# Patient Record
Sex: Female | Born: 1960 | Race: White | Hispanic: No | Marital: Married | Smoking: Former smoker
Health system: Southern US, Community
[De-identification: ages and names within clinical notes are randomized; demographics above are authoritative.]

## PROBLEM LIST (undated history)

## (undated) DIAGNOSIS — M1710 Unilateral primary osteoarthritis, unspecified knee: Secondary | ICD-10-CM

## (undated) DIAGNOSIS — M109 Gout, unspecified: Secondary | ICD-10-CM

## (undated) DIAGNOSIS — M179 Osteoarthritis of knee, unspecified: Secondary | ICD-10-CM

## (undated) DIAGNOSIS — R748 Abnormal levels of other serum enzymes: Secondary | ICD-10-CM

## (undated) DIAGNOSIS — T8859XA Other complications of anesthesia, initial encounter: Secondary | ICD-10-CM

## (undated) DIAGNOSIS — F329 Major depressive disorder, single episode, unspecified: Secondary | ICD-10-CM

## (undated) DIAGNOSIS — E785 Hyperlipidemia, unspecified: Secondary | ICD-10-CM

## (undated) DIAGNOSIS — F32A Depression, unspecified: Secondary | ICD-10-CM

## (undated) DIAGNOSIS — E559 Vitamin D deficiency, unspecified: Secondary | ICD-10-CM

## (undated) DIAGNOSIS — J189 Pneumonia, unspecified organism: Secondary | ICD-10-CM

## (undated) DIAGNOSIS — I10 Essential (primary) hypertension: Secondary | ICD-10-CM

## (undated) DIAGNOSIS — Z9889 Other specified postprocedural states: Secondary | ICD-10-CM

## (undated) DIAGNOSIS — F419 Anxiety disorder, unspecified: Secondary | ICD-10-CM

## (undated) DIAGNOSIS — R112 Nausea with vomiting, unspecified: Secondary | ICD-10-CM

## (undated) DIAGNOSIS — T4145XA Adverse effect of unspecified anesthetic, initial encounter: Secondary | ICD-10-CM

## (undated) DIAGNOSIS — R232 Flushing: Secondary | ICD-10-CM

## (undated) DIAGNOSIS — J302 Other seasonal allergic rhinitis: Secondary | ICD-10-CM

## (undated) HISTORY — DX: Depression, unspecified: F32.A

## (undated) HISTORY — DX: Major depressive disorder, single episode, unspecified: F32.9

## (undated) HISTORY — DX: Essential (primary) hypertension: I10

## (undated) HISTORY — DX: Gout, unspecified: M10.9

## (undated) HISTORY — PX: KNEE ARTHROSCOPY: SUR90

## (undated) HISTORY — PX: CARPAL TUNNEL RELEASE: SHX101

## (undated) HISTORY — PX: COLONOSCOPY: SHX174

## (undated) HISTORY — DX: Hyperlipidemia, unspecified: E78.5

## (undated) HISTORY — DX: Other seasonal allergic rhinitis: J30.2

---
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
## 2007-01-30 ENCOUNTER — Other Ambulatory Visit: Admission: RE | Admit: 2007-01-30 | Discharge: 2007-01-30 | Payer: Self-pay | Admitting: *Deleted

## 2012-01-31 ENCOUNTER — Encounter: Payer: Self-pay | Admitting: Gastroenterology

## 2012-03-05 ENCOUNTER — Ambulatory Visit (AMBULATORY_SURGERY_CENTER): Payer: BC Managed Care – PPO | Admitting: *Deleted

## 2012-03-05 VITALS — Ht 66.0 in | Wt 178.6 lb

## 2012-03-05 DIAGNOSIS — Z1211 Encounter for screening for malignant neoplasm of colon: Secondary | ICD-10-CM

## 2012-03-05 MED ORDER — MOVIPREP 100 G PO SOLR: ORAL | Status: DC

## 2012-03-07 ENCOUNTER — Encounter: Payer: Self-pay | Admitting: Gastroenterology

## 2012-03-19 ENCOUNTER — Encounter: Payer: Self-pay | Admitting: Gastroenterology

## 2012-04-26 ENCOUNTER — Emergency Department (HOSPITAL_COMMUNITY)
Admission: EM | Admit: 2012-04-26 | Discharge: 2012-04-26 | Disposition: A | Discharge status: Home / Self Care | Payer: Worker's Compensation | Attending: Emergency Medicine | Admitting: Emergency Medicine

## 2012-04-26 ENCOUNTER — Emergency Department (HOSPITAL_COMMUNITY): Payer: Worker's Compensation

## 2012-04-26 ENCOUNTER — Encounter (HOSPITAL_COMMUNITY): Payer: Self-pay | Admitting: *Deleted

## 2012-04-26 DIAGNOSIS — E785 Hyperlipidemia, unspecified: Secondary | ICD-10-CM | POA: Insufficient documentation

## 2012-04-26 DIAGNOSIS — S39012A Strain of muscle, fascia and tendon of lower back, initial encounter: Secondary | ICD-10-CM

## 2012-04-26 DIAGNOSIS — Y9389 Activity, other specified: Secondary | ICD-10-CM | POA: Insufficient documentation

## 2012-04-26 DIAGNOSIS — S139XXA Sprain of joints and ligaments of unspecified parts of neck, initial encounter: Secondary | ICD-10-CM | POA: Insufficient documentation

## 2012-04-26 DIAGNOSIS — Z7982 Long term (current) use of aspirin: Secondary | ICD-10-CM | POA: Insufficient documentation

## 2012-04-26 DIAGNOSIS — F329 Major depressive disorder, single episode, unspecified: Secondary | ICD-10-CM | POA: Insufficient documentation

## 2012-04-26 DIAGNOSIS — Z862 Personal history of diseases of the blood and blood-forming organs and certain disorders involving the immune mechanism: Secondary | ICD-10-CM | POA: Insufficient documentation

## 2012-04-26 DIAGNOSIS — F3289 Other specified depressive episodes: Secondary | ICD-10-CM | POA: Insufficient documentation

## 2012-04-26 DIAGNOSIS — S161XXA Strain of muscle, fascia and tendon at neck level, initial encounter: Secondary | ICD-10-CM

## 2012-04-26 DIAGNOSIS — S0990XA Unspecified injury of head, initial encounter: Secondary | ICD-10-CM | POA: Insufficient documentation

## 2012-04-26 DIAGNOSIS — Z87891 Personal history of nicotine dependence: Secondary | ICD-10-CM | POA: Insufficient documentation

## 2012-04-26 DIAGNOSIS — Z79899 Other long term (current) drug therapy: Secondary | ICD-10-CM | POA: Insufficient documentation

## 2012-04-26 DIAGNOSIS — W1809XA Striking against other object with subsequent fall, initial encounter: Secondary | ICD-10-CM | POA: Insufficient documentation

## 2012-04-26 DIAGNOSIS — J309 Allergic rhinitis, unspecified: Secondary | ICD-10-CM | POA: Insufficient documentation

## 2012-04-26 DIAGNOSIS — Z8639 Personal history of other endocrine, nutritional and metabolic disease: Secondary | ICD-10-CM | POA: Insufficient documentation

## 2012-04-26 DIAGNOSIS — IMO0002 Reserved for concepts with insufficient information to code with codable children: Secondary | ICD-10-CM | POA: Insufficient documentation

## 2012-04-26 DIAGNOSIS — Y929 Unspecified place or not applicable: Secondary | ICD-10-CM | POA: Insufficient documentation

## 2012-04-26 DIAGNOSIS — I10 Essential (primary) hypertension: Secondary | ICD-10-CM | POA: Insufficient documentation

## 2012-04-26 MED ORDER — HYDROCODONE-ACETAMINOPHEN 5-325 MG PO TABS
2.0000 | ORAL_TABLET | Freq: Once | ORAL | Status: AC
  Administered 2012-04-26: 2 via ORAL
  Filled 2012-04-26: qty 2

## 2012-04-26 MED ORDER — METHOCARBAMOL 500 MG PO TABS: 500.0000 mg | ORAL_TABLET | Freq: Two times a day (BID) | ORAL | Status: DC

## 2012-04-26 NOTE — ED Provider Notes (Signed)
History   This chart was scribed for non-physician practitioner working with Richardean Canal, MD by Gerlean Ren, ED Scribe. This patient was seen in room TR10C/TR10C and the patient's care was started at 3:19 PM.    CSN: [id number]  Arrival date & time 04/26/12  1159   First MD Initiated Contact with Patient 04/26/12 1500      No chief complaint on file.    The history is provided by the patient. No language interpreter was used.   [name] is a 52 y.o. female with h/o HTN and gout who presents to the Emergency Department complaining of mid-back pain rated as 5/10, neck pain rated as 5/10, and posterior head pain rated as 5/10 with associated HA after a fall while ambulating at work indoors on concrete floor.  Pt reports head trauma with the floor but denies LOC, changes in vision, or syncope.  Pt is a former smoker and denies alcohol use.  Past Medical History  Diagnosis Date  . Hypertension   . Hyperlipidemia   . Depression   . Gout   . Seasonal allergies     Past Surgical History  Procedure Date  . Cesarean section 1990  . Knee arthroscopy     right x 2  . Carpal tunnel release     right    Family History  Problem Relation Age of Onset  . Colon cancer Neg Hx   . Stomach cancer Neg Hx     History  Substance Use Topics  . Smoking status: Former Smoker    Quit date: 03/05/2004  . Smokeless tobacco: Never Used  . Alcohol Use: No    No OB history provided.   Review of Systems  HENT: Positive for neck pain.   Musculoskeletal: Positive for back pain.  Neurological: Positive for headaches. Negative for syncope.    Allergies  Demerol and Statins  Home Medications   Current Outpatient Rx  Name  Route  Sig  Dispense  Refill  . ALLOPURINOL 100 MG PO TABS   Oral   Take 100 mg by mouth daily.          Marland Kitchen AMLODIPINE BESYLATE 10 MG PO TABS   Oral   Take 10 mg by mouth daily.          . ASPIRIN 81 MG PO TBEC   Oral   Take 81 mg by mouth daily. Swallow  whole.         Marland Kitchen BENAZEPRIL-HYDROCHLOROTHIAZIDE 20-12.5 MG PO TABS   Oral   Take 2 tablets by mouth daily.          Marland Kitchen BYSTOLIC 10 MG PO TABS   Oral   Take 5 mg by mouth daily. Takes 1/2 tablet daily         . CETIRIZINE HCL 10 MG PO TABS   Oral   Take 10 mg by mouth at bedtime as needed. For allergies         . VITAMIN D 2000 UNITS PO CAPS   Oral   Take 2,000 Units by mouth daily.          Marland Kitchen FLUOXETINE HCL 40 MG PO CAPS   Oral   Take 40 mg by mouth daily.          Marland Kitchen PRAVASTATIN SODIUM 40 MG PO TABS   Oral   Take 40 mg by mouth every evening.          Marland Kitchen ZETIA 10 MG PO TABS  Oral   Take 10 mg by mouth daily.            BP 165/96  Pulse 65  Temp 97.8 F (36.6 C) (Oral)  Resp 18  SpO2 96%  Physical Exam  Nursing note and vitals reviewed. Constitutional: She is oriented to person, place, and time. She appears well-developed and well-nourished. No distress.  HENT:  Head: Normocephalic and atraumatic.       Atraumatic, no signs of laceration or contusion on the posterior head  Eyes: Conjunctivae normal and EOM are normal. Pupils are equal, round, and reactive to light.  Neck: Normal range of motion. Neck supple.       Cervical spine mildly tender to palpation over C-7 and cervical paraspinal muscles mildly tender to palpation  Cardiovascular: Normal rate, regular rhythm and normal heart sounds.  Exam reveals no gallop and no friction rub.   No murmur heard. Pulmonary/Chest: Effort normal and breath sounds normal. No respiratory distress. She has no wheezes.  Abdominal: Soft. There is no tenderness.  Musculoskeletal: Normal range of motion.       Tenderness over thoracic paraspinal muscles, lumbar paraspinal tender to palpation  Neurological: She is alert and oriented to person, place, and time. No cranial nerve deficit.       CN 3-12 intact`  Skin: Skin is warm and dry.  Psychiatric: She has a normal mood and affect. Her behavior is normal.    ED  Course  Procedures (including critical care time) DIAGNOSTIC STUDIES: Oxygen Saturation is 96% on room air, adequate by my interpretation.    COORDINATION OF CARE: 3:25 PM- Patient informed of clinical course including XR, understands medical decision-making process, and agrees with plan.   No results found for this or any previous visit. Dg Cervical Spine Complete  04/26/2012  *RADIOLOGY REPORT*  Clinical Data: Fall, hit back of head.  Neck pain.  CERVICAL SPINE - COMPLETE 4+ VIEW  Comparison: None.  Findings: Early degenerative changes of anterior spurring at C5-6. Normal alignment.  No fracture.  Prevertebral soft tissues are normal.  Mild degenerative facet disease diffusely bilaterally.  IMPRESSION: Degenerative changes as above.  No acute bony abnormality.   Original Report Authenticated By: Charlett Nose, M.D.      1. Cervical strain   2. Back strain       MDM  [place] head CT rules suggests no need for head CT this time, however I will image the patient's C-spine chief complaints of some tenderness over C7. I suspect that her tenderness in her back is likely muscle, as there is no sharp bony tenderness or obvious deformity.  I personally performed the services described in this documentation, which was scribed in my presence. The recorded information has been reviewed and is accurate.        Roxy Horseman, PA-C 04/26/12 1609

## 2012-04-26 NOTE — ED Provider Notes (Signed)
Medical screening examination/treatment/procedure(s) were performed by non-physician practitioner and as supervising physician I was immediately available for consultation/collaboration.   Annabelle Rexroad H Garnett Nunziata, MD 04/26/12 2345 

## 2012-04-26 NOTE — ED Notes (Signed)
Pt slipped on a waxed floor, hit a garbage can on her way down and landed on her back and head.  Pain to mid back and occiput.  No changes in vision.  Pt states she feels groggy, but ao x 4.  Takes 1 81mg  asa per day.  Perrl.

## 2012-04-26 NOTE — ED Notes (Signed)
Pt c/o pain in head and shoulders. States has gotten "tighter" as time goes on. No LOC.

## 2012-06-19 ENCOUNTER — Other Ambulatory Visit: Payer: Self-pay | Admitting: *Deleted

## 2012-06-19 MED ORDER — BENAZEPRIL-HYDROCHLOROTHIAZIDE 20-12.5 MG PO TABS: 2.0000 | ORAL_TABLET | Freq: Every day | ORAL | Status: DC

## 2012-07-20 ENCOUNTER — Other Ambulatory Visit: Payer: Self-pay | Admitting: Nurse Practitioner

## 2012-08-20 ENCOUNTER — Other Ambulatory Visit: Payer: Self-pay | Admitting: Nurse Practitioner

## 2012-08-21 NOTE — Telephone Encounter (Signed)
Patient last seen 01/23/12

## 2012-09-19 ENCOUNTER — Other Ambulatory Visit: Payer: Self-pay | Admitting: Nurse Practitioner

## 2012-09-26 ENCOUNTER — Other Ambulatory Visit: Payer: Self-pay | Admitting: Nurse Practitioner

## 2012-09-26 ENCOUNTER — Ambulatory Visit (INDEPENDENT_AMBULATORY_CARE_PROVIDER_SITE_OTHER): Payer: BC Managed Care – PPO | Admitting: Physician Assistant

## 2012-09-26 ENCOUNTER — Other Ambulatory Visit: Payer: Self-pay

## 2012-09-26 ENCOUNTER — Encounter: Payer: Self-pay | Admitting: Physician Assistant

## 2012-09-26 VITALS — BP 134/83 | HR 62 | Temp 97.0°F | Wt 178.0 lb

## 2012-09-26 DIAGNOSIS — E1159 Type 2 diabetes mellitus with other circulatory complications: Secondary | ICD-10-CM | POA: Insufficient documentation

## 2012-09-26 DIAGNOSIS — F329 Major depressive disorder, single episode, unspecified: Secondary | ICD-10-CM | POA: Insufficient documentation

## 2012-09-26 DIAGNOSIS — E785 Hyperlipidemia, unspecified: Secondary | ICD-10-CM | POA: Insufficient documentation

## 2012-09-26 DIAGNOSIS — I152 Hypertension secondary to endocrine disorders: Secondary | ICD-10-CM | POA: Insufficient documentation

## 2012-09-26 DIAGNOSIS — I10 Essential (primary) hypertension: Secondary | ICD-10-CM

## 2012-09-26 DIAGNOSIS — M109 Gout, unspecified: Secondary | ICD-10-CM

## 2012-09-26 DIAGNOSIS — E781 Pure hyperglyceridemia: Secondary | ICD-10-CM | POA: Insufficient documentation

## 2012-09-26 LAB — LIPID PANEL
Total CHOL/HDL Ratio: 4.2 Ratio
VLDL: 46 mg/dL — ABNORMAL HIGH (ref 0–40)

## 2012-09-26 LAB — BASIC METABOLIC PANEL WITH GFR
GFR, Est African American: >89 mL/min
GFR, Est Non African American: >89 mL/min
Glucose, Bld: 86 mg/dL (ref 70–99)
Potassium: 3.6 mEq/L (ref 3.5–5.3)
Sodium: 141 mEq/L (ref 135–145)

## 2012-09-26 LAB — HEPATIC FUNCTION PANEL
Bilirubin, Direct: 0.1 mg/dL (ref 0.0–0.3)
Indirect Bilirubin: 0.3 mg/dL (ref 0.0–0.9)

## 2012-09-26 MED ORDER — BENAZEPRIL-HYDROCHLOROTHIAZIDE 20-12.5 MG PO TABS: 2.0000 | ORAL_TABLET | Freq: Every day | ORAL | Status: DC

## 2012-09-26 MED ORDER — FLUOXETINE HCL 40 MG PO CAPS: 40.0000 mg | ORAL_CAPSULE | Freq: Every day | ORAL | Status: DC

## 2012-09-26 MED ORDER — NEBIVOLOL HCL 10 MG PO TABS: 5.0000 mg | ORAL_TABLET | Freq: Every day | ORAL | Status: DC

## 2012-09-26 MED ORDER — PRAVASTATIN SODIUM 40 MG PO TABS: 40.0000 mg | ORAL_TABLET | Freq: Every evening | ORAL | Status: DC

## 2012-09-26 MED ORDER — ALLOPURINOL 100 MG PO TABS: 100.0000 mg | ORAL_TABLET | Freq: Every day | ORAL | Status: DC

## 2012-09-26 MED ORDER — AMLODIPINE BESYLATE 10 MG PO TABS: 10.0000 mg | ORAL_TABLET | Freq: Every day | ORAL | Status: DC

## 2012-09-26 NOTE — Progress Notes (Signed)
Subjective:     Patient ID: [name], female   DOB: 06/12/1960, 52 y.o.   MRN: [id number]  HPI Pt here for f/u of HTN, gout, hyperlipid, and depression She states she has been doing well She denies any CP,SOB, lower ext edema, or change in endurance She relate hx of multiple joint pain Pt with a hx of gout and started on Allopurinol After further discussion she states she does not take this on a regular basis She also has a hx of not tolerating statins well but has done fine on the Pravachol  Review of Systems  All other systems reviewed and are negative.       Objective:   Physical Exam  Nursing note and vitals reviewed.  No bruits Heart- RRR w/o M Lungs- CTA bilat Pulses equal in upper ext No lower ext edema Mood approp Relaxed with good eye contact/interaction during exam    Assessment:     1. HTN (hypertension)   2. Gout   3. Other and unspecified hyperlipidemia   4. Depression        Plan:     Push fluids Take Allopurinol daily Continue all other meds RF done today for 6 months on all meds F/U in 6 months

## 2012-09-26 NOTE — Patient Instructions (Signed)

## 2012-10-19 ENCOUNTER — Other Ambulatory Visit: Payer: Self-pay | Admitting: Physician Assistant

## 2012-11-08 ENCOUNTER — Other Ambulatory Visit: Payer: Self-pay | Admitting: Nurse Practitioner

## 2012-11-08 ENCOUNTER — Ambulatory Visit: Payer: Self-pay | Admitting: Family Medicine

## 2013-02-07 ENCOUNTER — Other Ambulatory Visit: Payer: Self-pay

## 2013-02-08 ENCOUNTER — Other Ambulatory Visit: Payer: Self-pay | Admitting: Nurse Practitioner

## 2013-02-25 ENCOUNTER — Ambulatory Visit (INDEPENDENT_AMBULATORY_CARE_PROVIDER_SITE_OTHER): Payer: BC Managed Care – PPO | Admitting: General Practice

## 2013-02-25 VITALS — BP 123/73 | HR 72 | Temp 97.1°F | Ht 66.0 in | Wt 180.0 lb

## 2013-02-25 DIAGNOSIS — J329 Chronic sinusitis, unspecified: Secondary | ICD-10-CM

## 2013-02-25 MED ORDER — AZITHROMYCIN 250 MG PO TABS: ORAL_TABLET | ORAL | Status: DC

## 2013-02-25 NOTE — Patient Instructions (Signed)

## 2013-02-25 NOTE — Progress Notes (Signed)
  Subjective:    Patient ID: [name], female    DOB: 04/15/1960, 52 y.o.   MRN: [id number]  HPI Patient presents with complaints of slight dizziness, nasal congestion and sinus pressure. Reports onset was yesterday and gradually worsened. She reports a history of vertigo 10 years ago. Reports taking zyrtec lastnight with minimal relief.     Review of Systems  Constitutional: Negative for fever and chills.  HENT: Positive for congestion, postnasal drip and sinus pressure.   Respiratory: Negative for chest tightness, shortness of breath and wheezing.   Cardiovascular: Negative for chest pain and palpitations.  Neurological: Positive for dizziness. Negative for weakness and headaches.       Objective:   Physical Exam  Constitutional: She appears well-developed and well-nourished.  HENT:  Head: Normocephalic and atraumatic.  Nose: Right sinus exhibits maxillary sinus tenderness and frontal sinus tenderness. Left sinus exhibits maxillary sinus tenderness and frontal sinus tenderness.  Mouth/Throat: Posterior oropharyngeal erythema present.  Cardiovascular: Normal rate, regular rhythm and normal heart sounds.   No murmur heard. Pulmonary/Chest: Effort normal and breath sounds normal. No respiratory distress. She exhibits no tenderness.  Neurological: She is alert.  Skin: Skin is warm and dry. No rash noted.  Psychiatric: She has a normal mood and affect.          Assessment & Plan:  1. Sinusitis  - azithromycin (ZITHROMAX) 250 MG tablet; Take as directed  Dispense: 6 tablet; Refill: 0 -increase fluids -RTO if symptoms worsen or unresolved -Patient verbalized understanding -Coralie Keens, FNP-C

## 2013-05-17 ENCOUNTER — Other Ambulatory Visit: Payer: Self-pay | Admitting: Physician Assistant

## 2013-05-22 ENCOUNTER — Other Ambulatory Visit: Payer: Self-pay | Admitting: Physician Assistant

## 2013-06-07 ENCOUNTER — Other Ambulatory Visit: Payer: Self-pay | Admitting: Physician Assistant

## 2013-06-11 ENCOUNTER — Telehealth: Payer: Self-pay | Admitting: Nurse Practitioner

## 2013-06-11 NOTE — Telephone Encounter (Signed)
Increase in stress level at work and at home. Patient had to leave work today due to stress. Appt scheduled for tomorrow. Patient aware.

## 2013-06-12 ENCOUNTER — Ambulatory Visit (INDEPENDENT_AMBULATORY_CARE_PROVIDER_SITE_OTHER): Payer: BC Managed Care – PPO | Admitting: Family Medicine

## 2013-06-12 VITALS — BP 134/73 | HR 71 | Temp 98.7°F | Ht 66.0 in | Wt 184.2 lb

## 2013-06-12 DIAGNOSIS — M109 Gout, unspecified: Secondary | ICD-10-CM

## 2013-06-12 DIAGNOSIS — Z0000 Encounter for general adult medical examination without abnormal findings: Secondary | ICD-10-CM

## 2013-06-12 DIAGNOSIS — R232 Flushing: Secondary | ICD-10-CM

## 2013-06-12 DIAGNOSIS — E785 Hyperlipidemia, unspecified: Secondary | ICD-10-CM

## 2013-06-12 DIAGNOSIS — I10 Essential (primary) hypertension: Secondary | ICD-10-CM

## 2013-06-12 DIAGNOSIS — N951 Menopausal and female climacteric states: Secondary | ICD-10-CM

## 2013-06-12 DIAGNOSIS — F32A Depression, unspecified: Secondary | ICD-10-CM

## 2013-06-12 DIAGNOSIS — F3289 Other specified depressive episodes: Secondary | ICD-10-CM

## 2013-06-12 DIAGNOSIS — F329 Major depressive disorder, single episode, unspecified: Secondary | ICD-10-CM

## 2013-06-12 MED ORDER — ALLOPURINOL 100 MG PO TABS: 100.0000 mg | ORAL_TABLET | Freq: Every day | ORAL | Status: DC

## 2013-06-12 MED ORDER — AMLODIPINE BESYLATE 10 MG PO TABS: 10.0000 mg | ORAL_TABLET | Freq: Every day | ORAL | Status: DC

## 2013-06-12 MED ORDER — ALPRAZOLAM 0.5 MG PO TABS: 0.5000 mg | ORAL_TABLET | Freq: Three times a day (TID) | ORAL | Status: DC | PRN

## 2013-06-12 MED ORDER — BENAZEPRIL-HYDROCHLOROTHIAZIDE 20-12.5 MG PO TABS: 2.0000 | ORAL_TABLET | Freq: Every day | ORAL | Status: DC

## 2013-06-12 MED ORDER — CLONIDINE HCL 0.1 MG PO TABS: ORAL_TABLET | ORAL | Status: DC

## 2013-06-12 MED ORDER — NEBIVOLOL HCL 10 MG PO TABS: 10.0000 mg | ORAL_TABLET | Freq: Every day | ORAL | Status: DC

## 2013-06-12 MED ORDER — PRAVASTATIN SODIUM 40 MG PO TABS: 40.0000 mg | ORAL_TABLET | Freq: Every evening | ORAL | Status: DC

## 2013-06-12 MED ORDER — FLUOXETINE HCL 40 MG PO CAPS: 40.0000 mg | ORAL_CAPSULE | Freq: Every day | ORAL | Status: DC

## 2013-06-12 NOTE — Patient Instructions (Signed)
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
## 2013-06-13 LAB — VITAMIN B12: Vitamin B-12: 517 pg/mL (ref 211–946)

## 2013-06-13 LAB — CMP14+EGFR
ALT: 81 IU/L — ABNORMAL HIGH (ref 0–32)
AST: 43 IU/L — ABNORMAL HIGH (ref 0–40)
Albumin/Globulin Ratio: 2 (ref 1.1–2.5)
Albumin: 4.7 g/dL (ref 3.5–5.5)
Alkaline Phosphatase: 99 IU/L (ref 39–117)
BUN/Creatinine Ratio: 19 (ref 9–23)
BUN: 11 mg/dL (ref 6–24)
CO2: 23 mmol/L (ref 18–29)
Calcium: 10.3 mg/dL — ABNORMAL HIGH (ref 8.7–10.2)
Chloride: 101 mmol/L (ref 97–108)
Creatinine, Ser: 0.58 mg/dL (ref 0.57–1.00)
GFR calc Af Amer: 123 mL/min/{1.73_m2} (ref >59)
GFR calc non Af Amer: 107 mL/min/{1.73_m2} (ref >59)
Globulin, Total: 2.4 g/dL (ref 1.5–4.5)
Glucose: 125 mg/dL — ABNORMAL HIGH (ref 65–99)
Potassium: 3.8 mmol/L (ref 3.5–5.2)
Sodium: 144 mmol/L (ref 134–144)
Total Bilirubin: 0.3 mg/dL (ref 0.0–1.2)
Total Protein: 7.1 g/dL (ref 6.0–8.5)

## 2013-06-13 LAB — LIPID PANEL
Chol/HDL Ratio: 3.5 ratio units (ref 0.0–4.4)
Cholesterol, Total: 228 mg/dL — ABNORMAL HIGH (ref 100–199)
HDL: 66 mg/dL (ref >39)
LDL Calculated: 126 mg/dL — ABNORMAL HIGH (ref 0–99)
Triglycerides: 179 mg/dL — ABNORMAL HIGH (ref 0–149)
VLDL Cholesterol Cal: 36 mg/dL (ref 5–40)

## 2013-06-13 LAB — VITAMIN D 25 HYDROXY (VIT D DEFICIENCY, FRACTURES): Vit D, 25-Hydroxy: 28.3 ng/mL — ABNORMAL LOW (ref 30.0–100.0)

## 2013-06-13 LAB — ARTHRITIS PANEL
Anti Nuclear Antibody(ANA): NEGATIVE
Rhuematoid fact SerPl-aCnc: 10.7 IU/mL (ref 0.0–13.9)
Sed Rate: 5 mm/hr (ref 0–40)
Uric Acid: 5.8 mg/dL (ref 2.5–7.1)

## 2013-06-13 LAB — THYROID PANEL WITH TSH
Free Thyroxine Index: 2 (ref 1.2–4.9)
T3 Uptake Ratio: 25 % (ref 24–39)
T4, Total: 8 ug/dL (ref 4.5–12.0)
TSH: 1.46 u[IU]/mL (ref 0.450–4.500)

## 2013-06-13 LAB — FOLLICLE STIMULATING HORMONE: FSH: 75.4 m[IU]/mL

## 2013-06-17 ENCOUNTER — Other Ambulatory Visit: Payer: Self-pay | Admitting: Family Medicine

## 2013-06-27 ENCOUNTER — Encounter: Payer: Self-pay | Admitting: Family Medicine

## 2013-06-27 ENCOUNTER — Ambulatory Visit (INDEPENDENT_AMBULATORY_CARE_PROVIDER_SITE_OTHER): Payer: BC Managed Care – PPO | Admitting: Family Medicine

## 2013-06-27 VITALS — BP 137/73 | HR 59 | Temp 97.9°F | Ht 66.0 in | Wt 183.8 lb

## 2013-06-27 DIAGNOSIS — E559 Vitamin D deficiency, unspecified: Secondary | ICD-10-CM

## 2013-06-27 DIAGNOSIS — R748 Abnormal levels of other serum enzymes: Secondary | ICD-10-CM

## 2013-06-27 MED ORDER — VITAMIN D (ERGOCALCIFEROL) 1.25 MG (50000 UNIT) PO CAPS: 50000.0000 [IU] | ORAL_CAPSULE | ORAL | Status: DC

## 2013-06-27 NOTE — Progress Notes (Signed)
   Subjective:    Patient ID: [name], female    DOB: [birth year], 53 y.o.   MRN: [id number]  HPI  This 53 y.o. female presents for evaluation of anxiety and hot flashes.  She is feeling better since using xanax and she is not panicking as much.  She is taking the clonidine 0.1mg  po qhs and this is helping her not to have hot flashes.  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  She had labs and her LFT's were elevated.  Review of Systems No chest pain, SOB, HA, dizziness, vision change, N/V, diarrhea, constipation, dysuria, urinary urgency or frequency, myalgias, arthralgias or rash.     Objective:   Physical Exam  Vital signs noted  Well developed well nourished female.  HEENT - Head atraumatic Normocephalic                Eyes - PERRLA, Conjuctiva - clear Sclera- Clear EOMI                Ears - EAC's Wnl TM's Wnl Gross Hearing WNL                Nose - Nares patent                 Throat - oropharanx wnl Respiratory - Lungs CTA bilateral Cardiac - RRR S1 and S2 without murmur GI - Abdomen soft Nontender and bowel sounds active x 4 Extremities - No edema. Neuro - Grossly intact.      Assessment & Plan:  Elevated liver enzymes - Plan: Vitamin D, Ergocalciferol, (DRISDOL) 50000 UNITS CAPS capsule  Unspecified vitamin D deficiency - Plan: Hepatic function panel, Hepatitis panel, acute  Anxiety - Better controlled with xanax and follow up prn if needing xanax refill.  Amy CanterWilliam J Oxford FNP

## 2013-06-28 LAB — HEPATIC FUNCTION PANEL
ALT: 69 IU/L — ABNORMAL HIGH (ref 0–32)
AST: 39 IU/L (ref 0–40)
Albumin: 4.6 g/dL (ref 3.5–5.5)
Alkaline Phosphatase: 104 IU/L (ref 39–117)
Bilirubin, Direct: 0.13 mg/dL (ref 0.00–0.40)
Total Bilirubin: 0.4 mg/dL (ref 0.0–1.2)
Total Protein: 6.9 g/dL (ref 6.0–8.5)

## 2013-06-28 LAB — HEPATITIS PANEL, ACUTE
Hep A IgM: NEGATIVE
Hep B C IgM: NEGATIVE
Hep C Virus Ab: <0.1 s/co ratio (ref 0.0–0.9)
Hepatitis B Surface Ag: NEGATIVE

## 2013-07-04 ENCOUNTER — Telehealth: Payer: Self-pay | Admitting: Family Medicine

## 2013-07-09 NOTE — Telephone Encounter (Signed)
Done, faxed forms

## 2013-07-16 NOTE — Telephone Encounter (Signed)
Gina refaxed information to short term disability per fax number pt give.  Pt notified.  rs

## 2013-10-08 ENCOUNTER — Other Ambulatory Visit: Payer: Self-pay | Admitting: *Deleted

## 2013-10-08 ENCOUNTER — Other Ambulatory Visit: Payer: Self-pay | Admitting: Family Medicine

## 2013-10-08 ENCOUNTER — Telehealth: Payer: Self-pay | Admitting: Family Medicine

## 2013-10-08 DIAGNOSIS — F32A Depression, unspecified: Secondary | ICD-10-CM

## 2013-10-08 DIAGNOSIS — F329 Major depressive disorder, single episode, unspecified: Secondary | ICD-10-CM

## 2013-10-08 NOTE — Telephone Encounter (Signed)
Pt changing pharmacy and needs [facility] [place]. Last ov 06/27/13.

## 2013-10-08 NOTE — Telephone Encounter (Signed)
Appointment given for Friday at 10:30

## 2013-10-11 ENCOUNTER — Ambulatory Visit (INDEPENDENT_AMBULATORY_CARE_PROVIDER_SITE_OTHER): Payer: BC Managed Care – PPO | Admitting: Family Medicine

## 2013-10-11 ENCOUNTER — Encounter: Payer: Self-pay | Admitting: Family Medicine

## 2013-10-11 VITALS — BP 142/78 | HR 56 | Temp 97.0°F | Ht 66.0 in | Wt 184.4 lb

## 2013-10-11 DIAGNOSIS — F3289 Other specified depressive episodes: Secondary | ICD-10-CM

## 2013-10-11 DIAGNOSIS — F329 Major depressive disorder, single episode, unspecified: Secondary | ICD-10-CM

## 2013-10-11 DIAGNOSIS — F32A Depression, unspecified: Secondary | ICD-10-CM

## 2013-10-11 MED ORDER — ALPRAZOLAM 0.5 MG PO TABS: 0.5000 mg | ORAL_TABLET | Freq: Three times a day (TID) | ORAL | Status: DC | PRN

## 2013-10-11 NOTE — Progress Notes (Signed)
   Subjective:    Patient ID: [name], female    DOB: 07-11-1960, 53 y.o.   MRN: [id number]  HPI This 53 y.o. female presents for evaluation of anxiety.  She is taking xanax as relief and it is working well.   Review of Systems    No chest pain, SOB, HA, dizziness, vision change, N/V, diarrhea, constipation, dysuria, urinary urgency or frequency, myalgias, arthralgias or rash.  Objective:   Physical Exam  Vital signs noted  Well developed well nourished female.  HEENT - Head atraumatic Normocephalic                Eyes - PERRLA, Conjuctiva - clear Sclera- Clear EOMI                Ears - EAC's Wnl TM's Wnl Gross Hearing WNL                Nose - Nares patent                 Throat - oropharanx wnl Respiratory - Lungs CTA bilateral Cardiac - RRR S1 and S2 without murmur GI - Abdomen soft Nontender and bowel sounds active x 4 Extremities - No edema. Neuro - Grossly intact.      Assessment & Plan:  Depression - Plan: ALPRAZolam (XANAX) 0.5 MG tablet Follow up for refills  Deatra CanterWilliam J Herman Mell FNP

## 2013-10-18 ENCOUNTER — Ambulatory Visit: Payer: BC Managed Care – PPO | Admitting: General Practice

## 2013-11-11 ENCOUNTER — Other Ambulatory Visit: Payer: Self-pay

## 2013-11-11 DIAGNOSIS — F32A Depression, unspecified: Secondary | ICD-10-CM

## 2013-11-11 DIAGNOSIS — R232 Flushing: Secondary | ICD-10-CM

## 2013-11-11 DIAGNOSIS — I10 Essential (primary) hypertension: Secondary | ICD-10-CM

## 2013-11-11 DIAGNOSIS — F329 Major depressive disorder, single episode, unspecified: Secondary | ICD-10-CM

## 2013-11-11 MED ORDER — FLUOXETINE HCL 40 MG PO CAPS: 40.0000 mg | ORAL_CAPSULE | Freq: Every day | ORAL | Status: DC

## 2013-11-11 MED ORDER — CLONIDINE HCL 0.1 MG PO TABS
ORAL_TABLET | ORAL | Status: DC
Start: 2013-11-11 — End: 2018-09-10

## 2013-11-11 MED ORDER — BENAZEPRIL-HYDROCHLOROTHIAZIDE 20-12.5 MG PO TABS: 2.0000 | ORAL_TABLET | Freq: Every day | ORAL | Status: DC

## 2013-11-17 ENCOUNTER — Other Ambulatory Visit: Payer: Self-pay | Admitting: Physician Assistant

## 2013-11-19 ENCOUNTER — Telehealth: Payer: Self-pay | Admitting: Family Medicine

## 2013-11-19 MED ORDER — AMLODIPINE BESYLATE 10 MG PO TABS: ORAL_TABLET | ORAL | Status: DC

## 2013-11-19 NOTE — Telephone Encounter (Signed)
Pt needs 90 supply on Amlodipine Please review and advise

## 2013-11-19 NOTE — Telephone Encounter (Signed)
Pt notified [facility]  

## 2013-11-29 ENCOUNTER — Other Ambulatory Visit: Payer: Self-pay

## 2013-11-29 DIAGNOSIS — E785 Hyperlipidemia, unspecified: Secondary | ICD-10-CM

## 2013-11-29 MED ORDER — PRAVASTATIN SODIUM 40 MG PO TABS: 40.0000 mg | ORAL_TABLET | Freq: Every evening | ORAL | Status: DC

## 2014-01-29 ENCOUNTER — Other Ambulatory Visit: Payer: Self-pay | Admitting: Physician Assistant

## 2014-02-20 ENCOUNTER — Other Ambulatory Visit: Payer: Self-pay | Admitting: Family Medicine

## 2014-02-20 NOTE — Telephone Encounter (Signed)
Called patient, left message for her to call back and schedule follow up appointment as soon as possible.

## 2014-02-26 ENCOUNTER — Other Ambulatory Visit: Payer: Self-pay | Admitting: Family Medicine

## 2014-02-28 NOTE — Telephone Encounter (Signed)
Last labs 06/2013 

## 2014-04-25 ENCOUNTER — Other Ambulatory Visit: Payer: Self-pay | Admitting: Family Medicine

## 2014-05-18 ENCOUNTER — Other Ambulatory Visit: Payer: Self-pay | Admitting: Family Medicine

## 2014-05-19 NOTE — Telephone Encounter (Signed)
Patient of Amy SladeBill Oxford. Last seen in office on 10-11-13. Was notified at last refill that she NTBS. Please advise on a 90 day supply.

## 2014-05-20 NOTE — Telephone Encounter (Signed)
no more refills without being seen  

## 2014-06-07 ENCOUNTER — Other Ambulatory Visit: Payer: Self-pay | Admitting: Family Medicine

## 2014-06-09 NOTE — Telephone Encounter (Signed)
Last seen 10/11/13 B Oxford  Last lipid 06/12/13  No upcoming appt  Requesting a 90 day supply

## 2014-07-02 ENCOUNTER — Other Ambulatory Visit: Payer: Self-pay | Admitting: Family Medicine

## 2014-07-28 ENCOUNTER — Other Ambulatory Visit: Payer: Self-pay | Admitting: Family Medicine

## 2014-08-03 ENCOUNTER — Other Ambulatory Visit: Payer: Self-pay | Admitting: Family Medicine

## 2014-08-23 ENCOUNTER — Other Ambulatory Visit: Payer: Self-pay | Admitting: Family Medicine

## 2014-10-28 ENCOUNTER — Other Ambulatory Visit: Payer: Self-pay | Admitting: Family Medicine

## 2016-02-12 ENCOUNTER — Ambulatory Visit (INDEPENDENT_AMBULATORY_CARE_PROVIDER_SITE_OTHER): Payer: Self-pay | Admitting: Nurse Practitioner

## 2016-02-12 ENCOUNTER — Encounter: Payer: Self-pay | Admitting: Nurse Practitioner

## 2016-02-12 VITALS — BP 122/75 | HR 64 | Ht 65.0 in | Wt 189.0 lb

## 2016-02-12 DIAGNOSIS — Z024 Encounter for examination for driving license: Secondary | ICD-10-CM

## 2016-02-12 NOTE — Progress Notes (Signed)
Patient ID: [name], female   DOB: 11-12-1960, 55 y.o.   MRN: [id number]  [employer]- see scanned in assessment

## 2017-04-10 ENCOUNTER — Encounter: Payer: Self-pay | Admitting: Family Medicine

## 2017-04-10 ENCOUNTER — Ambulatory Visit: Payer: BC Managed Care – PPO | Admitting: Family Medicine

## 2017-04-10 VITALS — BP 113/75 | HR 62 | Temp 97.0°F | Ht 65.0 in | Wt 193.8 lb

## 2017-04-10 DIAGNOSIS — J0110 Acute frontal sinusitis, unspecified: Secondary | ICD-10-CM

## 2017-04-10 LAB — CULTURE, GROUP A STREP

## 2017-04-10 LAB — VERITOR FLU A/B WAIVED
INFLUENZA A: NEGATIVE
Influenza B: NEGATIVE

## 2017-04-10 LAB — RAPID STREP SCREEN (MED CTR MEBANE ONLY): STREP GP A AG, IA W/REFLEX: NEGATIVE

## 2017-04-10 MED ORDER — METHYLPREDNISOLONE ACETATE 80 MG/ML IJ SUSP
80.0000 mg | Freq: Once | INTRAMUSCULAR | Status: AC
  Administered 2017-04-10: 80 mg via INTRAMUSCULAR

## 2017-04-10 MED ORDER — AMOXICILLIN-POT CLAVULANATE 875-125 MG PO TABS: 1.0000 | ORAL_TABLET | Freq: Two times a day (BID) | ORAL | 0 refills | Status: DC

## 2017-04-10 NOTE — Patient Instructions (Signed)
Great to meet you!   Sinusitis, Adult Sinusitis is soreness and inflammation of your sinuses. Sinuses are hollow spaces in the bones around your face. They are located:  Around your eyes.  In the middle of your forehead.  Behind your nose.  In your cheekbones.  Your sinuses and nasal passages are lined with a stringy fluid (mucus). Mucus normally drains out of your sinuses. When your nasal tissues get inflamed or swollen, the mucus can get trapped or blocked so air cannot flow through your sinuses. This lets bacteria, viruses, and funguses grow, and that leads to infection. Follow these instructions at home: Medicines  Take, use, or apply over-the-counter and prescription medicines only as told by your doctor. These may include nasal sprays.  If you were prescribed an antibiotic medicine, take it as told by your doctor. Do not stop taking the antibiotic even if you start to feel better. Hydrate and Humidify  Drink enough water to keep your pee (urine) clear or pale yellow.  Use a cool mist humidifier to keep the humidity level in your home above 50%.  Breathe in steam for 10-15 minutes, 3-4 times a day or as told by your doctor. You can do this in the bathroom while a hot shower is running.  Try not to spend time in cool or dry air. Rest  Rest as much as possible.  Sleep with your head raised (elevated).  Make sure to get enough sleep each night. General instructions  Put a warm, moist washcloth on your face 3-4 times a day or as told by your doctor. This will help with discomfort.  Wash your hands often with soap and water. If there is no soap and water, use hand sanitizer.  Do not smoke. Avoid being around people who are smoking (secondhand smoke).  Keep all follow-up visits as told by your doctor. This is important. Contact a doctor if:  You have a fever.  Your symptoms get worse.  Your symptoms do not get better within 10 days. Get help right away if:  You  have a very bad headache.  You cannot stop throwing up (vomiting).  You have pain or swelling around your face or eyes.  You have trouble seeing.  You feel confused.  Your neck is stiff.  You have trouble breathing. This information is not intended to replace advice given to you by your health care provider. Make sure you discuss any questions you have with your health care provider. Document Released: 09/07/2007 Document Revised: 11/15/2015 Document Reviewed: 01/14/2015 Elsevier Interactive Patient Education  2018 Elsevier Inc.  

## 2017-04-10 NOTE — Progress Notes (Signed)
   HPI  Patient presents today here with acute illness.  Patient explains she has had 3-4 days of cough, congestion, headache, sinus tenderness over the frontal sinuses, body aches, and sore throat.  She is tolerating food and fluids like usual. She does have intermittent shortness of breath and nasal congestion. She denies chest pain.  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  PMH: Smoking status noted ROS: Per HPI  Objective: BP 113/75   Pulse 62   Temp (!) 97 F (36.1 C) (Oral)   Ht 5\' 5"  (1.651 m)   Wt 193 lb 12.8 oz (87.9 kg)   SpO2 96%   BMI 32.25 kg/m  Gen: NAD, alert, cooperative with exam HEENT: NCAT, oropharynx moist and clear, TMs normal bilaterally, nares clear, positive tenderness to palpation over the frontal sinuses bilaterally CV: RRR, good S1/S2, no murmur Resp: CTABL, no wheezes, non-labored Ext: No edema, warm Neuro: Alert and oriented, No gross deficits  Assessment and plan:  #Acute sinusitis Treat with Augmentin, likely superimposed viral illness as well. Discussed usual course of illness and supportive care Return to clinic as needed     Orders Placed This Encounter  Procedures  . Culture, Group A Strep    Order Specific Question:   Source    Answer:   throat  . Rapid Strep Screen (Not at [facility])  . Veritor Flu A/B Waived    Order Specific Question:   Source    Answer:   nasal    Murtis SinkSam Mikaeel Petrow, MD [facility] 04/10/2017, 11:00 AM

## 2017-04-12 LAB — CULTURE, GROUP A STREP: Strep A Culture: NEGATIVE

## 2017-05-05 ENCOUNTER — Telehealth: Payer: Self-pay | Admitting: Nurse Practitioner

## 2017-06-16 ENCOUNTER — Encounter: Payer: Self-pay | Admitting: Family Medicine

## 2017-06-16 ENCOUNTER — Ambulatory Visit: Payer: BC Managed Care – PPO | Admitting: Family Medicine

## 2017-06-16 VITALS — BP 126/71 | HR 62 | Temp 97.7°F | Resp 22 | Ht 65.0 in | Wt 197.8 lb

## 2017-06-16 DIAGNOSIS — J40 Bronchitis, not specified as acute or chronic: Secondary | ICD-10-CM

## 2017-06-16 DIAGNOSIS — J329 Chronic sinusitis, unspecified: Secondary | ICD-10-CM

## 2017-06-16 MED ORDER — PSEUDOEPHEDRINE-GUAIFENESIN ER 60-600 MG PO TB12: 1.0000 | ORAL_TABLET | Freq: Two times a day (BID) | ORAL | 0 refills | Status: AC

## 2017-06-16 MED ORDER — FLUCONAZOLE 150 MG PO TABS: 150.0000 mg | ORAL_TABLET | Freq: Once | ORAL | 0 refills | Status: AC

## 2017-06-16 MED ORDER — AMOXICILLIN-POT CLAVULANATE 875-125 MG PO TABS: 1.0000 | ORAL_TABLET | Freq: Two times a day (BID) | ORAL | 0 refills | Status: DC

## 2017-06-16 NOTE — Progress Notes (Addendum)
No chief complaint on file.   HPI  Patient presents today for Patient presents with upper respiratory congestion. Rhinorrhea that is frequently purulent. There is moderate sore throat. Chest hurts starting today. Patient reports coughing frequently as well.  Loose, but no sputum noted. There is subjective fever, with chills & sweats. The patientreports being short of breath. Onset was 5 days ago.without improvement.  PMH: Smoking status noted ROS: Per HPI  Objective: BP 126/71   Pulse 62   Temp 97.7 F (36.5 C) (Oral)   Resp (!) 22   Ht 5\' 5"  (1.651 m)   Wt 197 lb 12.8 oz (89.7 kg)   SpO2 96%   BMI 32.92 kg/m  Gen: NAD, alert, cooperative with exam HEENT: NCAT, Nasal passages swollen, red  CV: RRR, good S1/S2, no murmur Resp: Bronchitis changes with scattered wheezes, non-labored Ext: No edema, warm Neuro: Alert and oriented, No gross deficits  Assessment and plan:  1. Sinobronchitis     Meds ordered this encounter  Medications  . pseudoephedrine-guaifenesin (MUCINEX D) 60-600 MG 12 hr tablet    Sig: Take 1 tablet by mouth every 12 (twelve) hours for 10 days. As needed for congestion    Dispense:  20 tablet    Refill:  0  . amoxicillin-clavulanate (AUGMENTIN) 875-125 MG tablet    Sig: Take 1 tablet by mouth 2 (two) times daily. Take all of this medication    Dispense:  20 tablet    Refill:  0  . fluconazole (DIFLUCAN) 150 MG tablet    Sig: Take 1 tablet (150 mg total) by mouth once for 1 dose. At onset of symptoms. Repeat at end of treatment    Dispense:  2 tablet    Refill:  0    No orders of the defined types were placed in this encounter.   Follow up as needed.  Mechele ClaudeWarren Erman Thum, MD

## 2017-06-16 NOTE — Addendum Note (Signed)
Addended by: Mechele ClaudeSTACKS, Sharlotte Baka on: 06/16/2017 03:35 PM   Modules accepted: Orders

## 2017-08-01 LAB — HM MAMMOGRAPHY

## 2018-09-11 ENCOUNTER — Encounter (HOSPITAL_COMMUNITY): Payer: Self-pay

## 2018-09-11 NOTE — Patient Instructions (Addendum)
DUE TO COVID-19 NO VISITORS ARE ALLOWED IN THE HOSPITAL AT THIS TIME   COVID SWAB TESTING MUST BE COMPLETED ON:  Thursday, September 20, 2018@   2:00 pm           (Must self quarantine after testing. Follow instructions on handout.)   Your procedure is scheduled on: Monday, September 24, 2018   Surgery Time:  7:15AM-9:15AM   Report to [facility] Main  Entrance   Report to Short Stay at 5:30 AM   Call this number if you have problems the morning of surgery [phone]   Do not eat food:After Midnight.   May have liquids until 4:30AM day of surgery   CLEAR LIQUID DIET  Foods Allowed                                                                     Foods Excluded  Water, Black Coffee and tea, regular and decaf                             liquids that you cannot  Plain Jell-O in any flavor                                             see through such as: Fruit ices (not with fruit pulp)                                     milk, soups, orange juice  Iced Popsicles                                    All solid food Carbonated beverages, regular and diet                                    Cranberry, grape and apple juices Sports drinks like Gatorade Lightly seasoned clear broth or consume(fat free) Sugar, honey syrup  Sample Menu Breakfast                                Lunch                                     Supper Cranberry juice                    Beef broth                            Chicken broth Jell-O                                     Grape juice  Apple juice Coffee or tea                        Jell-O                                      Popsicle                                                Coffee or tea                        Coffee or tea   Complete one Ensure drink the morning of surgery at 4:30AM the day of surgery.   Brush your teeth the morning of surgery.   Do NOT smoke after Midnight   Take these medicines the morning of surgery  with A SIP OF WATER: Allopurinol, Amlodipine, Bystolic, Clonidine, Escitalopram, Lorazepam if needed                               You may not have any metal on your body including hair pins, jewelry, and body piercings             Do not wear make-up, lotions, powders, perfumes/cologne, or deodorant             Do not wear nail polish.  Do not shave  48 hours prior to surgery.              Do not bring valuables to the hospital. [facility] IS NOT             RESPONSIBLE   FOR VALUABLES.   Contacts, dentures or bridgework may not be worn into surgery.   Bring small overnight bag day of surgery.    Special Instructions: Bring a copy of your healthcare power of attorney and living will documents         the day of surgery if you haven't scanned them in before.              Please read over the following fact sheets you were given:  [facility] - Preparing for [facility] Before surgery, you can play an important role.  Because skin is not sterile, your skin needs to be as free of germs as possible.  You can reduce the number of germs on your skin by washing with CHG (chlorahexidine gluconate) soap before surgery.  CHG is an antiseptic cleaner which kills germs and bonds with the skin to continue killing germs even after washing. Please DO NOT use if you have an allergy to CHG or antibacterial soaps.  If your skin becomes reddened/irritated stop using the CHG and inform your nurse when you arrive at Short Stay. Do not shave (including legs and underarms) for at least 48 hours prior to the first CHG shower.  You may shave your face/neck.  Please follow these instructions carefully:  1.  Shower with CHG Soap the night before surgery and the  morning of surgery.  2.  If you choose to wash your hair, wash your hair first as usual with your normal  shampoo.  3.  After you shampoo, rinse your hair and body  thoroughly to remove the shampoo.                             4.  Use CHG as you would any other  liquid soap.  You can apply chg directly to the skin and wash.  Gently with a scrungie or clean washcloth.  5.  Apply the CHG Soap to your body ONLY FROM THE NECK DOWN.   Do   not use on face/ open                           Wound or open sores. Avoid contact with eyes, ears mouth and   genitals (private parts).                       Wash face,  Genitals (private parts) with your normal soap.             6.  Wash thoroughly, paying special attention to the area where your    surgery  will be performed.  7.  Thoroughly rinse your body with warm water from the neck down.  8.  DO NOT shower/wash with your normal soap after using and rinsing off the CHG Soap.                9.  Pat yourself dry with a clean towel.            10.  Wear clean pajamas.            11.  Place clean sheets on your bed the night of your first shower and do not  sleep with pets. Day of Surgery : Do not apply any lotions/deodorants the morning of surgery.  Please wear clean clothes to the hospital/surgery center.  FAILURE TO FOLLOW THESE INSTRUCTIONS MAY RESULT IN THE CANCELLATION OF YOUR SURGERY  PATIENT SIGNATURE_________________________________  NURSE SIGNATURE__________________________________  ________________________________________________________________________   Adam Phenix  An incentive spirometer is a tool that can help keep your lungs clear and active. This tool measures how well you are filling your lungs with each breath. Taking long deep breaths may help reverse or decrease the chance of developing breathing (pulmonary) problems (especially infection) following:  A long period of time when you are unable to move or be active. BEFORE THE PROCEDURE   If the spirometer includes an indicator to show your best effort, your nurse or respiratory therapist will set it to a desired goal.  If possible, sit up straight or lean slightly forward. Try not to slouch.  Hold the incentive spirometer in an  upright position. INSTRUCTIONS FOR USE  1. Sit on the edge of your bed if possible, or sit up as far as you can in bed or on a chair. 2. Hold the incentive spirometer in an upright position. 3. Breathe out normally. 4. Place the mouthpiece in your mouth and seal your lips tightly around it. 5. Breathe in slowly and as deeply as possible, raising the piston or the ball toward the top of the column. 6. Hold your breath for 3-5 seconds or for as long as possible. Allow the piston or ball to fall to the bottom of the column. 7. Remove the mouthpiece from your mouth and breathe out normally. 8. Rest for a few seconds and repeat Steps 1 through 7 at least 10 times every 1-2 hours when you are awake. Take your  time and take a few normal breaths between deep breaths. 9. The spirometer may include an indicator to show your best effort. Use the indicator as a goal to work toward during each repetition. 10. After each set of 10 deep breaths, practice coughing to be sure your lungs are clear. If you have an incision (the cut made at the time of surgery), support your incision when coughing by placing a pillow or rolled up towels firmly against it. Once you are able to get out of bed, walk around indoors and cough well. You may stop using the incentive spirometer when instructed by your caregiver.  RISKS AND COMPLICATIONS  Take your time so you do not get dizzy or light-headed.  If you are in pain, you may need to take or ask for pain medication before doing incentive spirometry. It is harder to take a deep breath if you are having pain. AFTER USE  Rest and breathe slowly and easily.  It can be helpful to keep track of a log of your progress. Your caregiver can provide you with a simple table to help with this. If you are using the spirometer at home, follow these instructions: SEEK MEDICAL CARE IF:   You are having difficultly using the spirometer.  You have trouble using the spirometer as often as  instructed.  Your pain medication is not giving enough relief while using the spirometer.  You develop fever of 100.5 F (38.1 C) or higher. SEEK IMMEDIATE MEDICAL CARE IF:   You cough up bloody sputum that had not been present before.  You develop fever of 102 F (38.9 C) or greater.  You develop worsening pain at or near the incision site. MAKE SURE YOU:   Understand these instructions.  Will watch your condition.  Will get help right away if you are not doing well or get worse. Document Released: 08/01/2006 Document Revised: 06/13/2011 Document Reviewed: 10/02/2006 [facility] Patient Information 2014 [employer] [place].   ________________________________________________________________________

## 2018-09-12 ENCOUNTER — Encounter (HOSPITAL_COMMUNITY)
Admission: RE | Admit: 2018-09-12 | Discharge: 2018-09-12 | Disposition: A | Discharge status: Home / Self Care | Payer: 59 | Source: Ambulatory Visit | Attending: Orthopedic Surgery | Admitting: Orthopedic Surgery

## 2018-09-12 ENCOUNTER — Ambulatory Visit (HOSPITAL_COMMUNITY)
Admission: RE | Admit: 2018-09-12 | Discharge: 2018-09-12 | Disposition: A | Discharge status: Home / Self Care | Payer: 59 | Source: Ambulatory Visit | Attending: Physician Assistant | Admitting: Physician Assistant

## 2018-09-12 ENCOUNTER — Encounter (HOSPITAL_COMMUNITY): Payer: Self-pay

## 2018-09-12 ENCOUNTER — Other Ambulatory Visit: Payer: Self-pay

## 2018-09-12 DIAGNOSIS — Z01818 Encounter for other preprocedural examination: Secondary | ICD-10-CM | POA: Insufficient documentation

## 2018-09-12 HISTORY — DX: Abnormal levels of other serum enzymes: R74.8

## 2018-09-12 HISTORY — DX: Other complications of anesthesia, initial encounter: T88.59XA

## 2018-09-12 HISTORY — DX: Pneumonia, unspecified organism: J18.9

## 2018-09-12 HISTORY — DX: Flushing: R23.2

## 2018-09-12 HISTORY — DX: Anxiety disorder, unspecified: F41.9

## 2018-09-12 HISTORY — DX: Other specified postprocedural states: Z98.890

## 2018-09-12 HISTORY — DX: Nausea with vomiting, unspecified: R11.2

## 2018-09-12 HISTORY — DX: Vitamin D deficiency, unspecified: E55.9

## 2018-09-12 LAB — COMPREHENSIVE METABOLIC PANEL
ALT: 79 U/L — ABNORMAL HIGH (ref 0–44)
AST: 45 U/L — ABNORMAL HIGH (ref 15–41)
Albumin: 4.1 g/dL (ref 3.5–5.0)
Alkaline Phosphatase: 99 U/L (ref 38–126)
Anion gap: 9 (ref 5–15)
BUN: 14 mg/dL (ref 6–20)
CO2: 27 mmol/L (ref 22–32)
Calcium: 9.1 mg/dL (ref 8.9–10.3)
Chloride: 103 mmol/L (ref 98–111)
Creatinine, Ser: 0.62 mg/dL (ref 0.44–1.00)
GFR calc Af Amer: >60 mL/min (ref >60)
GFR calc non Af Amer: >60 mL/min (ref >60)
Glucose, Bld: 82 mg/dL (ref 70–99)
Potassium: 3.3 mmol/L — ABNORMAL LOW (ref 3.5–5.1)
Sodium: 139 mmol/L (ref 135–145)
Total Bilirubin: 0.2 mg/dL — ABNORMAL LOW (ref 0.3–1.2)
Total Protein: 7.3 g/dL (ref 6.5–8.1)

## 2018-09-12 LAB — CBC WITH DIFFERENTIAL/PLATELET
Abs Immature Granulocytes: 0.05 10*3/uL (ref 0.00–0.07)
Basophils Absolute: 0.1 10*3/uL (ref 0.0–0.1)
Basophils Relative: 1 %
Eosinophils Absolute: 0.3 10*3/uL (ref 0.0–0.5)
Eosinophils Relative: 3 %
HCT: 45.1 % (ref 36.0–46.0)
Hemoglobin: 15.2 g/dL — ABNORMAL HIGH (ref 12.0–15.0)
Immature Granulocytes: 1 %
Lymphocytes Relative: 32 %
Lymphs Abs: 3.3 10*3/uL (ref 0.7–4.0)
MCH: 30.7 pg (ref 26.0–34.0)
MCHC: 33.7 g/dL (ref 30.0–36.0)
MCV: 91.1 fL (ref 80.0–100.0)
Monocytes Absolute: 0.7 10*3/uL (ref 0.1–1.0)
Monocytes Relative: 7 %
Neutro Abs: 6 10*3/uL (ref 1.7–7.7)
Neutrophils Relative %: 56 %
Platelets: 279 10*3/uL (ref 150–400)
RBC: 4.95 MIL/uL (ref 3.87–5.11)
RDW: 13.2 % (ref 11.5–15.5)
WBC: 10.5 10*3/uL (ref 4.0–10.5)
nRBC: 0 % (ref 0.0–0.2)

## 2018-09-12 LAB — PROTIME-INR
INR: 1 (ref 0.8–1.2)
Prothrombin Time: 12.7 seconds (ref 11.4–15.2)

## 2018-09-12 LAB — SURGICAL PCR SCREEN
MRSA, PCR: NEGATIVE
Staphylococcus aureus: NEGATIVE

## 2018-09-12 LAB — APTT: aPTT: 25 seconds (ref 24–36)

## 2018-09-13 LAB — URINE CULTURE: Culture: <10000 — AB

## 2018-09-17 ENCOUNTER — Encounter: Payer: Self-pay | Admitting: Cardiology

## 2018-09-17 ENCOUNTER — Other Ambulatory Visit: Payer: Self-pay

## 2018-09-17 ENCOUNTER — Ambulatory Visit (INDEPENDENT_AMBULATORY_CARE_PROVIDER_SITE_OTHER): Payer: 59 | Admitting: Cardiology

## 2018-09-17 VITALS — BP 150/80 | HR 57 | Ht 65.0 in | Wt 198.2 lb

## 2018-09-17 DIAGNOSIS — Z87891 Personal history of nicotine dependence: Secondary | ICD-10-CM

## 2018-09-17 DIAGNOSIS — Z01810 Encounter for preprocedural cardiovascular examination: Secondary | ICD-10-CM | POA: Diagnosis not present

## 2018-09-17 DIAGNOSIS — I10 Essential (primary) hypertension: Secondary | ICD-10-CM

## 2018-09-17 DIAGNOSIS — R9431 Abnormal electrocardiogram [ECG] [EKG]: Secondary | ICD-10-CM | POA: Diagnosis not present

## 2018-09-17 DIAGNOSIS — E782 Mixed hyperlipidemia: Secondary | ICD-10-CM | POA: Diagnosis not present

## 2018-09-17 MED ORDER — EZETIMIBE 10 MG PO TABS: 10.0000 mg | ORAL_TABLET | Freq: Every day | ORAL | 1 refills | Status: DC

## 2018-09-17 MED ORDER — EZETIMIBE 10 MG PO TABS: 10.0000 mg | ORAL_TABLET | Freq: Every day | ORAL | 3 refills | Status: DC

## 2018-09-17 NOTE — Progress Notes (Signed)
Primary Physician:  Tonye Becket., MD   Patient ID: [name], female    DOB: Mar 23, 1961, 58 y.o.   MRN: [id number]  Subjective:    Chief Complaint  Patient presents with  . New Patient (Initial Visit)    surgical clearance-knee surgery 6/22  . Abnormal ECG    HPI: [name]  is a 58 y.o. female  with hypertension, hyperlipidemia,  referred to [place] by Dr. Elsie Saas for preoperative cardiovascular examination for upcoming right knee arthroplasty on 06/22 and abnormal EKG.   She is still able to do short distance walking without any difficulty. Has right knee pain that limits her activities. She is able to climb stairs daily at home without difficulty. No chest pain, shortness of breath, leg swelling, palpitations, PND or orthopnea. No symptoms suggestive of claudication or TIA.   Has had hypertension since pregnancy with her 24 year old son. States that her blood pressure has been fairly well controlled with her current medications. Does report that cholesterol is uncontrolled. She has had myalgia with statin. She tolerated Repatha, but unable to get insurance approval.   Denies any previous cardiac workup. Denies any family history of MI or CVA. Former smoker that quit in 2005.   Past Medical History:  Diagnosis Date  . Anxiety   . Complication of anesthesia   . Depression   . DJD (degenerative joint disease) of knee   . Elevated liver enzymes   . Gout   . Hot flashes   . Hyperlipidemia   . Hypertension   . Pneumonia   . PONV (postoperative nausea and vomiting)    Very per pt.  . Seasonal allergies   . Vitamin D deficiency     Past Surgical History:  Procedure Laterality Date  . CARPAL TUNNEL RELEASE     right  . CESAREAN SECTION  1990  . COLONOSCOPY    . KNEE ARTHROSCOPY     right x 2    Social History   Socioeconomic History  . Marital status: Married    Spouse name: Not on file  . Number of children: 1  . Years of education: Not  on file  . Highest education level: Not on file  Occupational History  . Not on file  Social Needs  . Financial resource strain: Not on file  . Food insecurity    Worry: Not on file    Inability: Not on file  . Transportation needs    Medical: Not on file    Non-medical: Not on file  Tobacco Use  . Smoking status: Former Smoker    Packs/day: 1.00    Years: 18.00    Pack years: 18.00    Quit date: 03/05/2004    Years since quitting: 14.5  . Smokeless tobacco: Never Used  Substance and Sexual Activity  . Alcohol use: No  . Drug use: No  . Sexual activity: Yes  Lifestyle  . Physical activity    Days per week: Not on file    Minutes per session: Not on file  . Stress: Not on file  Relationships  . Social Herbalist on phone: Not on file    Gets together: Not on file    Attends religious service: Not on file    Active member of club or organization: Not on file    Attends meetings of clubs or organizations: Not on file    Relationship status: Not on file  . Intimate  partner violence    Fear of current or ex partner: Not on file    Emotionally abused: Not on file    Physically abused: Not on file    Forced sexual activity: Not on file  Other Topics Concern  . Not on file  Social History Narrative  . Not on file    Review of Systems  Constitution: Negative for decreased appetite, malaise/fatigue, weight gain and weight loss.  Eyes: Negative for visual disturbance.  Cardiovascular: Negative for chest pain, claudication, dyspnea on exertion, leg swelling, orthopnea, palpitations and syncope.  Respiratory: Negative for hemoptysis and wheezing.   Endocrine: Negative for cold intolerance and heat intolerance.  Hematologic/Lymphatic: Does not bruise/bleed easily.  Skin: Negative for nail changes.  Musculoskeletal: Positive for joint pain. Negative for muscle weakness and myalgias. Gout: right knee.  Gastrointestinal: Negative for abdominal pain, change in bowel  habit, nausea and vomiting.  Neurological: Negative for difficulty with concentration, dizziness, focal weakness and headaches.  Psychiatric/Behavioral: Negative for altered mental status and suicidal ideas.  All other systems reviewed and are negative.     Objective:  Blood pressure (!) 150/80, pulse (!) 57, height 5\' 5"  (1.651 m), weight 198 lb 3.2 oz (89.9 kg), SpO2 98 %. Body mass index is 32.98 kg/m.    Physical Exam  Constitutional: She is oriented to person, place, and time. Vital signs are normal. She appears well-developed and well-nourished.  HENT:  Head: Normocephalic and atraumatic.  Neck: Normal range of motion.  Cardiovascular: Normal rate, regular rhythm and intact distal pulses.  Murmur heard.  Early systolic murmur is present with a grade of 1/6 at the upper right sternal border. Pulmonary/Chest: Effort normal and breath sounds normal. No accessory muscle usage. No respiratory distress.  Abdominal: Soft. Bowel sounds are normal.  Musculoskeletal: Normal range of motion.  Neurological: She is alert and oriented to person, place, and time.  Skin: Skin is warm and dry.  Vitals reviewed.  Radiology: No results found.  Laboratory examination:    CMP Latest Ref Rng & Units 09/12/2018 06/27/2013 06/12/2013  Glucose 70 - 99 mg/dL 82 - 161(W125(H)  BUN 6 - 20 mg/dL 14 - 11  Creatinine 9.600.44 - 1.00 mg/dL 4.540.62 - 0.980.58  Sodium 119135 - 145 mmol/L 139 - 144  Potassium 3.5 - 5.1 mmol/L 3.3(L) - 3.8  Chloride 98 - 111 mmol/L 103 - 101  CO2 22 - 32 mmol/L 27 - 23  Calcium 8.9 - 10.3 mg/dL 9.1 - 10.3(H)  Total Protein 6.5 - 8.1 g/dL 7.3 6.9 7.1  Total Bilirubin 0.3 - 1.2 mg/dL 1.4(N0.2(L) 0.4 0.3  Alkaline Phos 38 - 126 U/L 99 104 99  AST 15 - 41 U/L 45(H) 39 43(H)  ALT 0 - 44 U/L 79(H) 69(H) 81(H)   CBC Latest Ref Rng & Units 09/12/2018  WBC 4.0 - 10.5 K/uL 10.5  Hemoglobin 12.0 - 15.0 g/dL 15.2(H)  Hematocrit 36.0 - 46.0 % 45.1  Platelets 150 - 400 K/uL 279   Lipid Panel      Component Value Date/Time   CHOL 228 (H) 06/12/2013 0935   TRIG 179 (H) 06/12/2013 0935   HDL 66 06/12/2013 0935   CHOLHDL 3.5 06/12/2013 0935   CHOLHDL 4.2 09/26/2012 1206   VLDL 46 (H) 09/26/2012 1206   LDLCALC 126 (H) 06/12/2013 0935   HEMOGLOBIN A1C No results found for: HGBA1C, MPG TSH No results for input(s): TSH in the last 8760 hours.  PRN Meds:. Medications Discontinued During This Encounter  Medication Reason  . Multiple Vitamins-Minerals (THRIVE FOR LIFE WOMENS PO) Discontinued by provider  . OVER THE COUNTER MEDICATION Discontinued by provider  . ezetimibe (ZETIA) 10 MG tablet    Current Meds  Medication Sig  . allopurinol (ZYLOPRIM) 100 MG tablet TAKE 1 TABLET EVERY DAY  . amLODipine (NORVASC) 10 MG tablet TAKE 1 TABLET BY MOUTH DAILY  . aspirin (ECOTRIN LOW STRENGTH) 81 MG EC tablet Take 81 mg by mouth daily. Swallow whole.  . benazepril-hydrochlorthiazide (LOTENSIN HCT) 20-12.5 MG per tablet TAKE 2 TABLETS EVERY DAY MUST BE SEEN FOR MORE REFILLS (Patient taking differently: Take 2 tablets by mouth daily. )  . BYSTOLIC 10 MG tablet TAKE 1/2 TABLET ONCE DAILY (Patient taking differently: Take 5 mg by mouth daily. )  . cetirizine (ZYRTEC ALLERGY) 10 MG tablet Take 10 mg by mouth at bedtime. For allergies  . cloNIDine (CATAPRES) 0.2 MG tablet Take 0.2 mg by mouth 2 (two) times daily.  Marland Kitchen. escitalopram (LEXAPRO) 20 MG tablet Take 20 mg by mouth daily.   Marland Kitchen. LORazepam (ATIVAN) 1 MG tablet Take 1 mg by mouth 3 (three) times daily as needed for anxiety.   . NONFORMULARY OR COMPOUNDED ITEM Apply 1 application topically daily as needed (pain). CBD Cream  . ULTRAM 50 MG tablet as needed.  . valACYclovir (VALTREX) 1000 MG tablet as needed.    Cardiac Studies:     Assessment:   1. Abnormal EKG   2. Preoperative cardiovascular examination   3. Mixed hyperlipidemia   4. Essential hypertension   5. Former smoker     EKG 09/17/2018: Sinus bradycardia at 56 bpm, normal  axis, diffuse nonspecific T wave inversion/flattening. Abnormal EKG.  Recommendations:   Patient with hypertension, hyperlipidemia, referred to us for preoperative cardiac for stratification and evaluation of abnormal EKG.  I suspect EKG abnormalities are related to hypertension.  She is without symptoms of angina or clinical evidence of heart failure.  She appears to have good functional capacity, do not feel that she needs stress testing at this time.  Will recommend obtaining echocardiogram prior to surgery given long history of hypertension and abnormal EKG.  If echocardiogram is without significant abnormalities, she may proceed with surgery low risk for perioperative CV complication.  Once echocardiogram is complete, will fax clearance letter to Dr. Thurston HoleWainer.  Does have aortic systolic murmur on physical exam that will also need evaluation.  Blood pressure is elevated today, but generally well controlled with current regimen, will continue the same.  She does mention hyperlipidemia that is uncontrolled and unable to tolerate statins in the past.  In view of her risk factors, she could potentially benefit from Zetia.  I will start her on Zetia 10 mg daily and recheck lipids in 2 to 3 months for follow-up.  Will request recent lipids from PCP office.  Encouraged her to make diet changes to also help with this.  I will see her back in 3 months after her lab results for follow-up on hyperlipidemia, if lipids have improved, will have her continue follow-up with her PCP for management.   *I have discussed this case with Dr. Rosemary HolmsPatwardhan and he personally examined the patient and participated in formulating the plan.*    Toniann FailAshton Haynes Breckyn Troyer, MSN, APRN, FNP-C [facility] Office: [phone] Fax: [phone]

## 2018-09-17 NOTE — H&P (Addendum)
TOTAL KNEE ADMISSION H&P  Patient is being admitted for right total knee arthroplasty.  Subjective:  Chief Complaint:right knee pain.  HPI: [name], 58 y.o. female, has a history of pain and functional disability in the right knee due to arthritis and has failed non-surgical conservative treatments for greater than 12 weeks to includeNSAID's and/or analgesics, corticosteriod injections, viscosupplementation injections, flexibility and strengthening excercises, supervised PT with diminished ADL's post treatment, use of assistive devices, weight reduction as appropriate and activity modification.  Onset of symptoms was gradual, starting 10 years ago with gradually worsening course since that time. The patient noted prior procedures on the knee to include  arthroscopy and menisectomy on the right knee(s).  Patient currently rates pain in the right knee(s) at 10 out of 10 with activity. Patient has night pain, worsening of pain with activity and weight bearing, pain that interferes with activities of daily living, crepitus and joint swelling.  Patient has evidence of subchondral sclerosis, periarticular osteophytes and joint space narrowing by imaging studies.  There is no active infection.  Patient Active Problem List   Diagnosis Date Noted  . Gout 09/26/2012  . HTN (hypertension) 09/26/2012  . Other and unspecified hyperlipidemia 09/26/2012  . Depression 09/26/2012   Past Medical History:  Diagnosis Date  . Anxiety   . Complication of anesthesia   . Depression   . DJD (degenerative joint disease) of knee   . Elevated liver enzymes   . Gout   . Hot flashes   . Hyperlipidemia   . Hypertension   . Pneumonia   . PONV (postoperative nausea and vomiting)    Very per pt.  . Seasonal allergies   . Vitamin D deficiency     Past Surgical History:  Procedure Laterality Date  . CARPAL TUNNEL RELEASE     right  . CESAREAN SECTION  1990  . COLONOSCOPY    . KNEE ARTHROSCOPY     right x 2    No current facility-administered medications for this encounter.    Current Outpatient Medications  Medication Sig Dispense Refill Last Dose  . allopurinol (ZYLOPRIM) 100 MG tablet TAKE 1 TABLET EVERY DAY 30 tablet 0   . amLODipine (NORVASC) 10 MG tablet TAKE 1 TABLET BY MOUTH DAILY 90 tablet 2   . aspirin (ECOTRIN LOW STRENGTH) 81 MG EC tablet Take 81 mg by mouth daily. Swallow whole.     . benazepril-hydrochlorthiazide (LOTENSIN HCT) 20-12.5 MG per tablet TAKE 2 TABLETS EVERY DAY MUST BE SEEN FOR MORE REFILLS (Patient taking differently: Take 2 tablets by mouth daily. ) 180 tablet 0   . BYSTOLIC 10 MG tablet TAKE 1/2 TABLET ONCE DAILY (Patient taking differently: Take 5 mg by mouth daily. ) 45 tablet 0   . cetirizine (ZYRTEC ALLERGY) 10 MG tablet Take 10 mg by mouth at bedtime. For allergies     . cloNIDine (CATAPRES) 0.2 MG tablet Take 0.2 mg by mouth 2 (two) times daily.     Marland Kitchen escitalopram (LEXAPRO) 20 MG tablet Take 20 mg by mouth daily.   1   . LORazepam (ATIVAN) 1 MG tablet Take 1 mg by mouth 3 (three) times daily as needed for anxiety.   2   . NONFORMULARY OR COMPOUNDED ITEM Apply 1 application topically daily as needed (pain). CBD Cream     . ezetimibe (ZETIA) 10 MG tablet Take 1 tablet (10 mg total) by mouth daily. 90 tablet 1   . ULTRAM 50 MG tablet as needed.     Marland Kitchen  valACYclovir (VALTREX) 1000 MG tablet as needed.      Allergies  Allergen Reactions  . Demerol  [Meperidine Hcl] Nausea And Vomiting  . Demerol [Meperidine] Nausea And Vomiting  . Statins     Muscle and bone pain    Social History   Tobacco Use  . Smoking status: Former Smoker    Packs/day: 1.00    Years: 18.00    Pack years: 18.00    Quit date: 03/05/2004    Years since quitting: 14.5  . Smokeless tobacco: Never Used  Substance Use Topics  . Alcohol use: No    Family History  Problem Relation Age of Onset  . Hypertension Mother   . Arthritis Mother   . Hypertension Father   . Arthritis  Father   . Colon cancer Neg Hx   . Stomach cancer Neg Hx      ROS  Objective:  Physical Exam  Vital signs in last 24 hours: Temp:  [97.8 F (36.6 C)] 97.8 F (36.6 C) (06/15 1400) Pulse Rate:  [57-64] 64 (06/15 1400) BP: (150-174)/(77-80) 174/77 (06/15 1400) SpO2:  [98 %-99 %] 99 % (06/15 1400) Weight:  [89.9 kg-90 kg] 90 kg (06/15 1400)  Labs:   Estimated body mass index is 33.02 kg/m as calculated from the following:   Height as of this encounter: 5\' 5"  (1.651 m).   Weight as of this encounter: 90 kg.   Imaging Review Plain radiographs demonstrate severe degenerative joint disease of the right knee(s). The overall alignment issignificant varus. The bone quality appears to be good for age and reported activity level.      Assessment/Plan:  End stage arthritis, right knee   The patient history, physical examination, clinical judgment of the provider and imaging studies are consistent with end stage degenerative joint disease of the right knee(s) and total knee arthroplasty is deemed medically necessary. The treatment options including medical management, injection therapy arthroscopy and arthroplasty were discussed at length. The risks and benefits of total knee arthroplasty were presented and reviewed. The risks due to aseptic loosening, infection, stiffness, patella tracking problems, thromboembolic complications and other imponderables were discussed. The patient acknowledged the explanation, agreed to proceed with the plan and consent was signed. Patient is being admitted for inpatient treatment for surgery, pain control, PT, OT, prophylactic antibiotics, VTE prophylaxis, progressive ambulation and ADL's and discharge planning. The patient is planning to be discharged home with home health services  [facility]/2018 [facility]   Follow up with Dr Thurston HoleWainer 2 weeks post op and physical therapy needs to be scheduled [facility]    Anticipated LOS equal to or  greater than 2 midnights due to - Age 57 and older with one or more of the following:  - Obesity  - Expected need for hospital services (PT, OT, Nursing) required for safe  discharge  - Anticipated need for postoperative skilled nursing care or inpatient rehab  - Active co-morbidities: Coronary Artery Disease and Advanced Liver Disease OR

## 2018-09-18 ENCOUNTER — Ambulatory Visit (HOSPITAL_COMMUNITY)
Admission: RE | Admit: 2018-09-18 | Discharge: 2018-09-18 | Disposition: A | Discharge status: Home / Self Care | Payer: 59 | Source: Ambulatory Visit | Attending: Cardiology | Admitting: Cardiology

## 2018-09-18 DIAGNOSIS — Z01810 Encounter for preprocedural cardiovascular examination: Secondary | ICD-10-CM | POA: Insufficient documentation

## 2018-09-18 DIAGNOSIS — E785 Hyperlipidemia, unspecified: Secondary | ICD-10-CM | POA: Diagnosis not present

## 2018-09-18 DIAGNOSIS — I358 Other nonrheumatic aortic valve disorders: Secondary | ICD-10-CM | POA: Diagnosis not present

## 2018-09-18 DIAGNOSIS — I10 Essential (primary) hypertension: Secondary | ICD-10-CM | POA: Diagnosis not present

## 2018-09-18 DIAGNOSIS — Z87891 Personal history of nicotine dependence: Secondary | ICD-10-CM | POA: Diagnosis not present

## 2018-09-18 DIAGNOSIS — R9431 Abnormal electrocardiogram [ECG] [EKG]: Secondary | ICD-10-CM | POA: Diagnosis not present

## 2018-09-18 NOTE — Progress Notes (Signed)
  Echocardiogram 2D Echocardiogram has been performed.  [name] 09/18/2018, 10:43 AM

## 2018-09-19 ENCOUNTER — Encounter: Payer: Self-pay | Admitting: Cardiology

## 2018-09-19 ENCOUNTER — Ambulatory Visit: Payer: 59 | Admitting: Cardiology

## 2018-09-19 NOTE — Progress Notes (Signed)
Surgical clearance letter sent to Dr. Noemi Chapel

## 2018-09-20 ENCOUNTER — Other Ambulatory Visit (HOSPITAL_COMMUNITY)
Admission: RE | Admit: 2018-09-20 | Discharge: 2018-09-20 | Disposition: A | Discharge status: Home / Self Care | Payer: 59 | Source: Ambulatory Visit | Attending: Orthopedic Surgery | Admitting: Orthopedic Surgery

## 2018-09-20 DIAGNOSIS — Z1159 Encounter for screening for other viral diseases: Secondary | ICD-10-CM | POA: Insufficient documentation

## 2018-09-20 LAB — SARS CORONAVIRUS 2 (TAT 6-24 HRS): SARS Coronavirus 2: NEGATIVE

## 2018-09-23 MED ORDER — BUPIVACAINE LIPOSOME 1.3 % IJ SUSP
20.0000 mL | Freq: Once | INTRAMUSCULAR | Status: DC
  Filled 2018-09-23: qty 20

## 2018-09-23 NOTE — Anesthesia Preprocedure Evaluation (Addendum)
Anesthesia Evaluation    Reviewed: Allergy & Precautions, H&P , Patient's Chart, lab work & pertinent test results  History of Anesthesia Complications (+) PONV and history of anesthetic complications  Airway Mallampati: II  TM Distance: >3 FB Neck ROM: Full    Dental no notable dental hx.    Pulmonary neg pulmonary ROS, former smoker,    Pulmonary exam normal breath sounds clear to auscultation       Cardiovascular Exercise Tolerance: Good hypertension, Pt. on medications and Pt. on home beta blockers negative cardio ROS Normal cardiovascular exam Rhythm:Regular Rate:Normal     Neuro/Psych PSYCHIATRIC DISORDERS Anxiety Depression negative neurological ROS     GI/Hepatic negative GI ROS, Neg liver ROS,   Endo/Other  negative endocrine ROS  Renal/GU negative Renal ROS  negative genitourinary   Musculoskeletal  (+) Arthritis , Osteoarthritis,    Abdominal   Peds  Hematology negative hematology ROS (+)   Anesthesia Other Findings   Reproductive/Obstetrics negative OB ROS                           Anesthesia Physical Anesthesia Plan  ASA: II  Anesthesia Plan: Spinal   Post-op Pain Management:  Regional for Post-op pain   Induction:   PONV Risk Score and Plan: 2 and Ondansetron and Treatment may vary due to age or medical condition  Airway Management Planned: Nasal Cannula, Simple Face Mask and Mask  Additional Equipment:   Intra-op Plan:   Post-operative Plan:   Informed Consent: I have reviewed the patients History and Physical, chart, labs and discussed the procedure including the risks, benefits and alternatives for the proposed anesthesia with the patient or authorized representative who has indicated his/her understanding and acceptance.       Plan Discussed with: Anesthesiologist, CRNA and Surgeon  Anesthesia Plan Comments: (  )        Anesthesia Quick  Evaluation

## 2018-09-24 ENCOUNTER — Encounter (HOSPITAL_COMMUNITY)
Admission: RE | Disposition: A | Discharge status: Home Health | Payer: Self-pay | Source: Home / Self Care | Attending: Orthopedic Surgery

## 2018-09-24 ENCOUNTER — Inpatient Hospital Stay (HOSPITAL_COMMUNITY)
Admission: RE | Admit: 2018-09-24 | Discharge: 2018-09-25 | DRG: 470 | Disposition: A | Discharge status: Home Health | Payer: 59 | Attending: Orthopedic Surgery | Admitting: Orthopedic Surgery

## 2018-09-24 ENCOUNTER — Other Ambulatory Visit: Payer: Self-pay

## 2018-09-24 ENCOUNTER — Encounter (HOSPITAL_COMMUNITY): Payer: Self-pay

## 2018-09-24 ENCOUNTER — Inpatient Hospital Stay (HOSPITAL_COMMUNITY): Payer: 59 | Admitting: Physician Assistant

## 2018-09-24 ENCOUNTER — Inpatient Hospital Stay (HOSPITAL_COMMUNITY): Payer: 59 | Admitting: Certified Registered"

## 2018-09-24 DIAGNOSIS — Z7982 Long term (current) use of aspirin: Secondary | ICD-10-CM | POA: Diagnosis not present

## 2018-09-24 DIAGNOSIS — M948X8 Other specified disorders of cartilage, other site: Secondary | ICD-10-CM | POA: Diagnosis present

## 2018-09-24 DIAGNOSIS — M25761 Osteophyte, right knee: Secondary | ICD-10-CM | POA: Diagnosis present

## 2018-09-24 DIAGNOSIS — Z79899 Other long term (current) drug therapy: Secondary | ICD-10-CM | POA: Diagnosis not present

## 2018-09-24 DIAGNOSIS — Z87891 Personal history of nicotine dependence: Secondary | ICD-10-CM | POA: Diagnosis not present

## 2018-09-24 DIAGNOSIS — I10 Essential (primary) hypertension: Secondary | ICD-10-CM | POA: Diagnosis present

## 2018-09-24 DIAGNOSIS — Z8701 Personal history of pneumonia (recurrent): Secondary | ICD-10-CM

## 2018-09-24 DIAGNOSIS — M109 Gout, unspecified: Secondary | ICD-10-CM | POA: Diagnosis present

## 2018-09-24 DIAGNOSIS — J302 Other seasonal allergic rhinitis: Secondary | ICD-10-CM | POA: Diagnosis present

## 2018-09-24 DIAGNOSIS — Z8261 Family history of arthritis: Secondary | ICD-10-CM | POA: Diagnosis not present

## 2018-09-24 DIAGNOSIS — M1711 Unilateral primary osteoarthritis, right knee: Secondary | ICD-10-CM | POA: Diagnosis present

## 2018-09-24 DIAGNOSIS — Z8249 Family history of ischemic heart disease and other diseases of the circulatory system: Secondary | ICD-10-CM | POA: Diagnosis not present

## 2018-09-24 DIAGNOSIS — E785 Hyperlipidemia, unspecified: Secondary | ICD-10-CM | POA: Diagnosis present

## 2018-09-24 DIAGNOSIS — F419 Anxiety disorder, unspecified: Secondary | ICD-10-CM | POA: Diagnosis present

## 2018-09-24 DIAGNOSIS — F329 Major depressive disorder, single episode, unspecified: Secondary | ICD-10-CM | POA: Diagnosis present

## 2018-09-24 HISTORY — DX: Unilateral primary osteoarthritis, unspecified knee: M17.10

## 2018-09-24 HISTORY — DX: Osteoarthritis of knee, unspecified: M17.9

## 2018-09-24 HISTORY — PX: TOTAL KNEE ARTHROPLASTY: SHX125

## 2018-09-24 SURGERY — ARTHROPLASTY, KNEE, TOTAL
Anesthesia: Spinal | Laterality: Right

## 2018-09-24 MED ORDER — DOCUSATE SODIUM 100 MG PO CAPS
100.0000 mg | ORAL_CAPSULE | Freq: Two times a day (BID) | ORAL | Status: DC
  Administered 2018-09-24 – 2018-09-25 (×2): 100 mg via ORAL
  Filled 2018-09-24 (×2): qty 1

## 2018-09-24 MED ORDER — LORAZEPAM 1 MG PO TABS: 1.0000 mg | ORAL_TABLET | Freq: Three times a day (TID) | ORAL | Status: DC | PRN

## 2018-09-24 MED ORDER — FENTANYL CITRATE (PF) 100 MCG/2ML IJ SOLN
INTRAMUSCULAR | Status: DC | PRN
  Administered 2018-09-24 (×2): 50 ug via INTRAVENOUS

## 2018-09-24 MED ORDER — PROPOFOL 10 MG/ML IV BOLUS
INTRAVENOUS | Status: AC
  Filled 2018-09-24: qty 40

## 2018-09-24 MED ORDER — DEXAMETHASONE SODIUM PHOSPHATE 10 MG/ML IJ SOLN
INTRAMUSCULAR | Status: AC
  Filled 2018-09-24: qty 1

## 2018-09-24 MED ORDER — MENTHOL 3 MG MT LOZG: 1.0000 | LOZENGE | OROMUCOSAL | Status: DC | PRN

## 2018-09-24 MED ORDER — DEXAMETHASONE SODIUM PHOSPHATE 10 MG/ML IJ SOLN
8.0000 mg | Freq: Once | INTRAMUSCULAR | Status: AC
  Administered 2018-09-24: 08:00:00 8 mg via INTRAVENOUS

## 2018-09-24 MED ORDER — PROPOFOL 10 MG/ML IV BOLUS
INTRAVENOUS | Status: AC
  Filled 2018-09-24: qty 20

## 2018-09-24 MED ORDER — EPHEDRINE SULFATE-NACL 50-0.9 MG/10ML-% IV SOSY
PREFILLED_SYRINGE | INTRAVENOUS | Status: DC | PRN
  Administered 2018-09-24: 5 mg via INTRAVENOUS

## 2018-09-24 MED ORDER — EPHEDRINE 5 MG/ML INJ
INTRAVENOUS | Status: AC
  Filled 2018-09-24: qty 10

## 2018-09-24 MED ORDER — LIDOCAINE 2% (20 MG/ML) 5 ML SYRINGE
INTRAMUSCULAR | Status: AC
  Filled 2018-09-24: qty 5

## 2018-09-24 MED ORDER — METOCLOPRAMIDE HCL 5 MG/ML IJ SOLN: 5.0000 mg | Freq: Three times a day (TID) | INTRAMUSCULAR | Status: DC | PRN

## 2018-09-24 MED ORDER — GABAPENTIN 300 MG PO CAPS
300.0000 mg | ORAL_CAPSULE | Freq: Every day | ORAL | Status: DC
  Administered 2018-09-24: 20:00:00 300 mg via ORAL
  Filled 2018-09-24: qty 1

## 2018-09-24 MED ORDER — TRANEXAMIC ACID-NACL 1000-0.7 MG/100ML-% IV SOLN
1000.0000 mg | INTRAVENOUS | Status: AC
  Administered 2018-09-24: 1000 mg via INTRAVENOUS
  Filled 2018-09-24: qty 100

## 2018-09-24 MED ORDER — DEXAMETHASONE SODIUM PHOSPHATE 10 MG/ML IJ SOLN
10.0000 mg | Freq: Three times a day (TID) | INTRAMUSCULAR | Status: DC
  Administered 2018-09-24 – 2018-09-25 (×3): 10 mg via INTRAVENOUS
  Filled 2018-09-24 (×3): qty 1

## 2018-09-24 MED ORDER — ONDANSETRON HCL 4 MG/2ML IJ SOLN: 4.0000 mg | Freq: Four times a day (QID) | INTRAMUSCULAR | Status: DC | PRN

## 2018-09-24 MED ORDER — CHLORHEXIDINE GLUCONATE 4 % EX LIQD: 60.0000 mL | Freq: Once | CUTANEOUS | Status: DC

## 2018-09-24 MED ORDER — BUPIVACAINE-EPINEPHRINE 0.25% -1:200000 IJ SOLN
INTRAMUSCULAR | Status: DC | PRN
  Administered 2018-09-24: 30 mL

## 2018-09-24 MED ORDER — ACETAMINOPHEN 160 MG/5ML PO SOLN: 325.0000 mg | ORAL | Status: DC | PRN

## 2018-09-24 MED ORDER — CLONIDINE HCL 0.1 MG PO TABS
0.2000 mg | ORAL_TABLET | Freq: Two times a day (BID) | ORAL | Status: DC
  Administered 2018-09-24 – 2018-09-25 (×2): 0.2 mg via ORAL
  Filled 2018-09-24 (×2): qty 2

## 2018-09-24 MED ORDER — WATER FOR IRRIGATION, STERILE IR SOLN
Status: DC | PRN
  Administered 2018-09-24: 2000 mL

## 2018-09-24 MED ORDER — OXYCODONE HCL 5 MG PO TABS: 5.0000 mg | ORAL_TABLET | Freq: Once | ORAL | Status: DC | PRN

## 2018-09-24 MED ORDER — BUPIVACAINE-EPINEPHRINE (PF) 0.25% -1:200000 IJ SOLN
INTRAMUSCULAR | Status: AC
  Filled 2018-09-24: qty 30

## 2018-09-24 MED ORDER — MIDAZOLAM HCL 2 MG/2ML IJ SOLN
INTRAMUSCULAR | Status: AC
  Filled 2018-09-24: qty 2

## 2018-09-24 MED ORDER — CEFAZOLIN SODIUM-DEXTROSE 2-4 GM/100ML-% IV SOLN
2.0000 g | Freq: Four times a day (QID) | INTRAVENOUS | Status: AC
  Administered 2018-09-24 (×2): 2 g via INTRAVENOUS
  Filled 2018-09-24 (×2): qty 100

## 2018-09-24 MED ORDER — OXYCODONE HCL 5 MG PO TABS
5.0000 mg | ORAL_TABLET | ORAL | Status: DC | PRN
  Administered 2018-09-24 – 2018-09-25 (×4): 5 mg via ORAL
  Filled 2018-09-24 (×4): qty 1

## 2018-09-24 MED ORDER — ESMOLOL HCL 100 MG/10ML IV SOLN
INTRAVENOUS | Status: AC
  Filled 2018-09-24: qty 10

## 2018-09-24 MED ORDER — 0.9 % SODIUM CHLORIDE (POUR BTL) OPTIME
TOPICAL | Status: DC | PRN
  Administered 2018-09-24: 09:00:00 1000 mL

## 2018-09-24 MED ORDER — PROPOFOL 10 MG/ML IV BOLUS
INTRAVENOUS | Status: DC | PRN
  Administered 2018-09-24: 20 mg via INTRAVENOUS
  Administered 2018-09-24: 40 mg via INTRAVENOUS

## 2018-09-24 MED ORDER — FENTANYL CITRATE (PF) 100 MCG/2ML IJ SOLN: 25.0000 ug | INTRAMUSCULAR | Status: DC | PRN

## 2018-09-24 MED ORDER — ONDANSETRON HCL 4 MG/2ML IJ SOLN: 4.0000 mg | Freq: Once | INTRAMUSCULAR | Status: DC | PRN

## 2018-09-24 MED ORDER — POVIDONE-IODINE 7.5 % EX SOLN: Freq: Once | CUTANEOUS | Status: DC

## 2018-09-24 MED ORDER — GLYCOPYRROLATE PF 0.2 MG/ML IJ SOSY
PREFILLED_SYRINGE | INTRAMUSCULAR | Status: AC
  Filled 2018-09-24: qty 1

## 2018-09-24 MED ORDER — LACTATED RINGERS IV SOLN
INTRAVENOUS | Status: DC
  Administered 2018-09-24 (×2): via INTRAVENOUS

## 2018-09-24 MED ORDER — DIPHENHYDRAMINE HCL 12.5 MG/5ML PO ELIX: 12.5000 mg | ORAL_SOLUTION | ORAL | Status: DC | PRN

## 2018-09-24 MED ORDER — CLONIDINE HCL (ANALGESIA) 100 MCG/ML EP SOLN
EPIDURAL | Status: DC | PRN
  Administered 2018-09-24: 100 ug

## 2018-09-24 MED ORDER — MEPERIDINE HCL 50 MG/ML IJ SOLN: 6.2500 mg | INTRAMUSCULAR | Status: DC | PRN

## 2018-09-24 MED ORDER — GLYCOPYRROLATE PF 0.2 MG/ML IJ SOSY
PREFILLED_SYRINGE | INTRAMUSCULAR | Status: DC | PRN
  Administered 2018-09-24: .2 mg via INTRAVENOUS

## 2018-09-24 MED ORDER — HYDROMORPHONE HCL 1 MG/ML IJ SOLN: 0.5000 mg | INTRAMUSCULAR | Status: DC | PRN

## 2018-09-24 MED ORDER — PHENYLEPHRINE 40 MCG/ML (10ML) SYRINGE FOR IV PUSH (FOR BLOOD PRESSURE SUPPORT)
PREFILLED_SYRINGE | INTRAVENOUS | Status: AC
  Filled 2018-09-24: qty 10

## 2018-09-24 MED ORDER — FENTANYL CITRATE (PF) 100 MCG/2ML IJ SOLN
INTRAMUSCULAR | Status: AC
  Filled 2018-09-24: qty 2

## 2018-09-24 MED ORDER — CEFUROXIME SODIUM 1.5 G IV SOLR
INTRAVENOUS | Status: AC
  Filled 2018-09-24: qty 1.5

## 2018-09-24 MED ORDER — PROPOFOL 10 MG/ML IV BOLUS
INTRAVENOUS | Status: AC
  Filled 2018-09-24: qty 60

## 2018-09-24 MED ORDER — BUPIVACAINE IN DEXTROSE 0.75-8.25 % IT SOLN
INTRATHECAL | Status: DC | PRN
  Administered 2018-09-24: 1.6 mL via INTRATHECAL

## 2018-09-24 MED ORDER — LIDOCAINE 2% (20 MG/ML) 5 ML SYRINGE
INTRAMUSCULAR | Status: DC | PRN
  Administered 2018-09-24: 50 mg via INTRAVENOUS

## 2018-09-24 MED ORDER — POLYETHYLENE GLYCOL 3350 17 G PO PACK
17.0000 g | PACK | Freq: Two times a day (BID) | ORAL | Status: DC
  Administered 2018-09-25: 17 g via ORAL
  Filled 2018-09-24 (×2): qty 1

## 2018-09-24 MED ORDER — BUPIVACAINE LIPOSOME 1.3 % IJ SUSP
INTRAMUSCULAR | Status: DC | PRN
  Administered 2018-09-24: 20 mL

## 2018-09-24 MED ORDER — ONDANSETRON HCL 4 MG/2ML IJ SOLN
INTRAMUSCULAR | Status: AC
  Filled 2018-09-24: qty 2

## 2018-09-24 MED ORDER — PHENYLEPHRINE 40 MCG/ML (10ML) SYRINGE FOR IV PUSH (FOR BLOOD PRESSURE SUPPORT)
PREFILLED_SYRINGE | INTRAVENOUS | Status: DC | PRN
  Administered 2018-09-24: 80 ug via INTRAVENOUS

## 2018-09-24 MED ORDER — ACETAMINOPHEN 500 MG PO TABS
1000.0000 mg | ORAL_TABLET | Freq: Four times a day (QID) | ORAL | Status: AC
  Administered 2018-09-24 – 2018-09-25 (×4): 1000 mg via ORAL
  Filled 2018-09-24 (×4): qty 2

## 2018-09-24 MED ORDER — ACETAMINOPHEN 325 MG PO TABS: 325.0000 mg | ORAL_TABLET | ORAL | Status: DC | PRN

## 2018-09-24 MED ORDER — SODIUM CHLORIDE 0.9 % IJ SOLN
INTRAMUSCULAR | Status: DC | PRN
  Administered 2018-09-24: 50 mL

## 2018-09-24 MED ORDER — ALLOPURINOL 100 MG PO TABS
100.0000 mg | ORAL_TABLET | Freq: Every day | ORAL | Status: DC
  Filled 2018-09-24: qty 1

## 2018-09-24 MED ORDER — OXYCODONE HCL 5 MG/5ML PO SOLN: 5.0000 mg | Freq: Once | ORAL | Status: DC | PRN

## 2018-09-24 MED ORDER — ONDANSETRON HCL 4 MG/2ML IJ SOLN
INTRAMUSCULAR | Status: DC | PRN
  Administered 2018-09-24: 4 mg via INTRAVENOUS

## 2018-09-24 MED ORDER — POTASSIUM CHLORIDE IN NACL 20-0.9 MEQ/L-% IV SOLN
INTRAVENOUS | Status: DC
  Administered 2018-09-24 (×2): via INTRAVENOUS
  Filled 2018-09-24 (×3): qty 1000

## 2018-09-24 MED ORDER — PROPOFOL 500 MG/50ML IV EMUL
INTRAVENOUS | Status: DC | PRN
  Administered 2018-09-24: 25 ug/kg/min via INTRAVENOUS

## 2018-09-24 MED ORDER — POVIDONE-IODINE 10 % EX SWAB
2.0000 "application " | Freq: Once | CUTANEOUS | Status: AC
  Administered 2018-09-24: 2 via TOPICAL

## 2018-09-24 MED ORDER — PROPOFOL 10 MG/ML IV BOLUS
INTRAVENOUS | Status: AC
  Filled 2018-09-24: qty 80

## 2018-09-24 MED ORDER — SODIUM CHLORIDE (PF) 0.9 % IJ SOLN
INTRAMUSCULAR | Status: AC
  Filled 2018-09-24: qty 50

## 2018-09-24 MED ORDER — PHENOL 1.4 % MT LIQD
1.0000 | OROMUCOSAL | Status: DC | PRN
  Filled 2018-09-24: qty 177

## 2018-09-24 MED ORDER — NEBIVOLOL HCL 5 MG PO TABS
5.0000 mg | ORAL_TABLET | Freq: Every day | ORAL | Status: DC
  Filled 2018-09-24: qty 1

## 2018-09-24 MED ORDER — ROPIVACAINE HCL 7.5 MG/ML IJ SOLN
INTRAMUSCULAR | Status: DC | PRN
  Administered 2018-09-24: 30 mL via PERINEURAL

## 2018-09-24 MED ORDER — METOCLOPRAMIDE HCL 5 MG PO TABS: 5.0000 mg | ORAL_TABLET | Freq: Three times a day (TID) | ORAL | Status: DC | PRN

## 2018-09-24 MED ORDER — ASPIRIN EC 325 MG PO TBEC
325.0000 mg | DELAYED_RELEASE_TABLET | Freq: Every day | ORAL | Status: DC
  Administered 2018-09-25: 08:00:00 325 mg via ORAL
  Filled 2018-09-24: qty 1

## 2018-09-24 MED ORDER — SODIUM CHLORIDE 0.9 % IV SOLN
INTRAVENOUS | Status: DC | PRN
  Administered 2018-09-24: 08:00:00 25 ug/min via INTRAVENOUS
  Administered 2018-09-24: 08:00:00 10 ug/min via INTRAVENOUS

## 2018-09-24 MED ORDER — PHENYLEPHRINE HCL (PRESSORS) 10 MG/ML IV SOLN
INTRAVENOUS | Status: AC
  Filled 2018-09-24: qty 1

## 2018-09-24 MED ORDER — ESCITALOPRAM OXALATE 20 MG PO TABS
20.0000 mg | ORAL_TABLET | Freq: Every day | ORAL | Status: DC
  Administered 2018-09-25: 10:00:00 20 mg via ORAL
  Filled 2018-09-24: qty 1

## 2018-09-24 MED ORDER — SODIUM CHLORIDE 0.9 % IR SOLN
Status: DC | PRN
  Administered 2018-09-24 (×2): 1000 mL

## 2018-09-24 MED ORDER — MIDAZOLAM HCL 5 MG/5ML IJ SOLN
INTRAMUSCULAR | Status: DC | PRN
  Administered 2018-09-24 (×2): 1 mg via INTRAVENOUS

## 2018-09-24 MED ORDER — ALUM & MAG HYDROXIDE-SIMETH 200-200-20 MG/5ML PO SUSP: 30.0000 mL | ORAL | Status: DC | PRN

## 2018-09-24 MED ORDER — BUPIVACAINE LIPOSOME 1.3 % IJ SUSP
20.0000 mL | Freq: Once | INTRAMUSCULAR | Status: DC
  Filled 2018-09-24: qty 20

## 2018-09-24 MED ORDER — ONDANSETRON HCL 4 MG PO TABS: 4.0000 mg | ORAL_TABLET | Freq: Four times a day (QID) | ORAL | Status: DC | PRN

## 2018-09-24 MED ORDER — POVIDONE-IODINE 10 % EX SWAB: 2.0000 "application " | Freq: Once | CUTANEOUS | Status: DC

## 2018-09-24 MED ORDER — CEFAZOLIN SODIUM-DEXTROSE 2-4 GM/100ML-% IV SOLN
2.0000 g | INTRAVENOUS | Status: AC
  Administered 2018-09-24: 08:00:00 2 g via INTRAVENOUS
  Filled 2018-09-24: qty 100

## 2018-09-24 SURGICAL SUPPLY — 71 items
APL PRP STRL LF DISP 70% ISPRP (MISCELLANEOUS) ×2
ATTUNE MED DOME PAT 32 KNEE (Knees) ×1 IMPLANT
ATTUNE MED DOME PAT 32MM KNEE (Knees) ×1 IMPLANT
ATTUNE PS FEM RT SZ 5 CEM KNEE (Femur) ×2 IMPLANT
ATTUNE PSRP INSR SZ5 5 KNEE (Insert) ×1 IMPLANT
ATTUNE PSRP INSR SZ5 5MM KNEE (Insert) ×1 IMPLANT
BAG SPEC THK2 15X12 ZIP CLS (MISCELLANEOUS) ×1
BAG ZIPLOCK 12X15 (MISCELLANEOUS) ×3 IMPLANT
BASE TIBIAL ROT PLAT SZ 5 KNEE (Knees) IMPLANT
BLADE SAGITTAL 25.0X1.19X90 (BLADE) ×2 IMPLANT
BLADE SAGITTAL 25.0X1.19X90MM (BLADE) ×1
BLADE SAW SGTL 13X75X1.27 (BLADE) ×3 IMPLANT
BNDG CMPR MED 10X6 ELC LF (GAUZE/BANDAGES/DRESSINGS) ×1
BNDG ELASTIC 6X10 VLCR STRL LF (GAUZE/BANDAGES/DRESSINGS) ×3 IMPLANT
BOWL SMART MIX CTS (DISPOSABLE) ×3 IMPLANT
BSPLAT TIB 5 CMNT ROT PLAT STR (Knees) ×1 IMPLANT
CEMENT HV SMART SET (Cement) ×6 IMPLANT
CHLORAPREP W/TINT 26 (MISCELLANEOUS) ×6 IMPLANT
CLOSURE WOUND 1/2 X4 (GAUZE/BANDAGES/DRESSINGS) ×1
COVER SURGICAL LIGHT HANDLE (MISCELLANEOUS) ×3 IMPLANT
COVER WAND RF STERILE (DRAPES) IMPLANT
CUFF TOURN SGL QUICK 34 (TOURNIQUET CUFF) ×3
CUFF TRNQT CYL 34X4.125X (TOURNIQUET CUFF) ×1 IMPLANT
DECANTER SPIKE VIAL GLASS SM (MISCELLANEOUS) ×6 IMPLANT
DRAPE ORTHO SPLIT 77X108 STRL (DRAPES) ×2
DRAPE SHEET LG 3/4 BI-LAMINATE (DRAPES) ×3 IMPLANT
DRAPE SURG ORHT 6 SPLT 77X108 (DRAPES) ×1 IMPLANT
DRAPE U-SHAPE 47X51 STRL (DRAPES) ×3 IMPLANT
DRESSING AQUACEL AG SP 3.5X10 (GAUZE/BANDAGES/DRESSINGS) IMPLANT
DRSG AQUACEL AG ADV 3.5X10 (GAUZE/BANDAGES/DRESSINGS) ×3 IMPLANT
DRSG AQUACEL AG SP 3.5X10 (GAUZE/BANDAGES/DRESSINGS) ×3
DRSG MEPILEX BORDER 4X4 (GAUZE/BANDAGES/DRESSINGS) ×2 IMPLANT
ELECT CAUTERY BLADE TIP 2.5 (TIP) ×3
ELECT REM PT RETURN 15FT ADLT (MISCELLANEOUS) ×3 IMPLANT
ELECTRODE CAUTERY BLDE TIP 2.5 (TIP) ×1 IMPLANT
GLOVE BIO SURGEON STRL SZ7 (GLOVE) ×3 IMPLANT
GLOVE BIOGEL PI IND STRL 7.0 (GLOVE) ×1 IMPLANT
GLOVE BIOGEL PI IND STRL 7.5 (GLOVE) ×1 IMPLANT
GLOVE BIOGEL PI INDICATOR 7.0 (GLOVE) ×2
GLOVE BIOGEL PI INDICATOR 7.5 (GLOVE) ×2
GLOVE SS BIOGEL STRL SZ 7.5 (GLOVE) ×1 IMPLANT
GLOVE SUPERSENSE BIOGEL SZ 7.5 (GLOVE) ×2
GOWN STRL REUS W/ TWL LRG LVL3 (GOWN DISPOSABLE) ×1 IMPLANT
GOWN STRL REUS W/ TWL XL LVL3 (GOWN DISPOSABLE) ×1 IMPLANT
GOWN STRL REUS W/TWL LRG LVL3 (GOWN DISPOSABLE) ×3
GOWN STRL REUS W/TWL XL LVL3 (GOWN DISPOSABLE) ×2
HANDPIECE INTERPULSE COAX TIP (DISPOSABLE) ×3
HOLDER FOLEY CATH W/STRAP (MISCELLANEOUS) ×2 IMPLANT
HOOD PEEL AWAY FLYTE STAYCOOL (MISCELLANEOUS) ×9 IMPLANT
KIT TURNOVER KIT A (KITS) ×3 IMPLANT
MANIFOLD NEPTUNE II (INSTRUMENTS) ×3 IMPLANT
MARKER SKIN DUAL TIP RULER LAB (MISCELLANEOUS) ×3 IMPLANT
NEEDLE HYPO 22GX1.5 SAFETY (NEEDLE) ×3 IMPLANT
NS IRRIG 1000ML POUR BTL (IV SOLUTION) ×3 IMPLANT
PACK TOTAL KNEE CUSTOM (KITS) ×3 IMPLANT
PIN STEINMAN FIXATION KNEE (PIN) ×2 IMPLANT
PIN THREADED HEADED SIGMA (PIN) ×2 IMPLANT
PROTECTOR NERVE ULNAR (MISCELLANEOUS) ×3 IMPLANT
SET HNDPC FAN SPRY TIP SCT (DISPOSABLE) ×1 IMPLANT
STAPLER VISISTAT 35W (STAPLE) IMPLANT
STRIP CLOSURE SKIN 1/2X4 (GAUZE/BANDAGES/DRESSINGS) ×2 IMPLANT
SUT MNCRL AB 3-0 PS2 18 (SUTURE) ×3 IMPLANT
SUT VIC AB 0 CT1 36 (SUTURE) ×6 IMPLANT
SUT VIC AB 1 CT1 36 (SUTURE) ×3 IMPLANT
SUT VIC AB 2-0 CT1 27 (SUTURE) ×6
SUT VIC AB 2-0 CT1 TAPERPNT 27 (SUTURE) ×2 IMPLANT
SYR CONTROL 10ML LL (SYRINGE) ×6 IMPLANT
TIBIAL BASE ROT PLAT SZ 5 KNEE (Knees) ×3 IMPLANT
TRAY FOLEY MTR SLVR 14FR STAT (SET/KITS/TRAYS/PACK) ×3 IMPLANT
WATER STERILE IRR 1000ML POUR (IV SOLUTION) ×6 IMPLANT
YANKAUER SUCT BULB TIP NO VENT (SUCTIONS) ×3 IMPLANT

## 2018-09-24 NOTE — Anesthesia Procedure Notes (Addendum)
Spinal  Patient location during procedure: OR Start time: 09/24/2018 7:30 AM End time: 09/24/2018 7:35 AM Staffing Anesthesiologist: Janeece Riggers, MD Preanesthetic Checklist Completed: patient identified, site marked, surgical consent, pre-op evaluation, timeout performed, IV checked, risks and benefits discussed and monitors and equipment checked Spinal Block Patient position: sitting Prep: DuraPrep Patient monitoring: heart rate, cardiac monitor, continuous pulse ox and blood pressure Approach: midline Location: L3-4 Injection technique: single-shot Needle Needle type: Sprotte  Needle gauge: 24 G Needle length: 9 cm Assessment Sensory level: T4

## 2018-09-24 NOTE — Transfer of Care (Signed)
Immediate Anesthesia Transfer of Care Note  Patient: [name]  Procedure(s) Performed: TOTAL KNEE ARTHROPLASTY (Right )  Patient Location: PACU  Anesthesia Type:Spinal and MAC combined with regional for post-op pain  Level of Consciousness: awake, oriented and patient cooperative  Airway & Oxygen Therapy: Patient Spontanous Breathing and Patient connected to face mask oxygen  Post-op Assessment: Report given to RN and Post -op Vital signs reviewed and stable  Post vital signs: Reviewed and stable  Last Vitals:  Vitals Value Taken Time  BP 111/100 09/24/18 0949  Temp    Pulse 82 09/24/18 0950  Resp 18 09/24/18 0950  SpO2 99 % 09/24/18 0950  Vitals shown include unvalidated device data.  Last Pain:  Vitals:   09/24/18 6237  TempSrc:   PainSc: 0-No pain         Complications: No apparent anesthesia complications

## 2018-09-24 NOTE — Interval H&P Note (Signed)
History and Physical Interval Note:  09/24/2018 6:32 AM  [name]  has presented today for surgery, with the diagnosis of djd right knee.  The various methods of treatment have been discussed with the patient and family. After consideration of risks, benefits and other options for treatment, the patient has consented to  Procedure(s): TOTAL KNEE ARTHROPLASTY (Right) as a surgical intervention.  The patient's history has been reviewed, patient examined, no change in status, stable for surgery.  I have reviewed the patient's chart and labs.  Questions were answered to the patient's satisfaction.     [name]

## 2018-09-24 NOTE — Anesthesia Postprocedure Evaluation (Signed)
Anesthesia Post Note  Patient: [name]  Procedure(s) Performed: TOTAL KNEE ARTHROPLASTY (Right )     Patient location during evaluation: PACU Anesthesia Type: Spinal Level of consciousness: oriented and awake and alert Pain management: pain level controlled Vital Signs Assessment: post-procedure vital signs reviewed and stable Respiratory status: spontaneous breathing, respiratory function stable and patient connected to nasal cannula oxygen Cardiovascular status: blood pressure returned to baseline and stable Postop Assessment: no headache, no backache and no apparent nausea or vomiting Anesthetic complications: no    Last Vitals:  Vitals:   09/24/18 1053 09/24/18 1156  BP:  129/66  Pulse: 70 75  Resp:  16  Temp: 36.8 C 36.9 C  SpO2:  96%    Last Pain:  Vitals:   09/24/18 1156  TempSrc: Axillary  PainSc:                  Valgene Deloatch

## 2018-09-24 NOTE — Anesthesia Procedure Notes (Signed)
Anesthesia Regional Block: Adductor canal block   Pre-Anesthetic Checklist: ,, timeout performed, Correct Patient, Correct Site, Correct Laterality, Correct Procedure, Correct Position, site marked, Risks and benefits discussed,  Surgical consent,  Pre-op evaluation,  At surgeon's request and post-op pain management  Laterality: Right  Prep: chloraprep       Needles:  Injection technique: Single-shot  Needle Type: Echogenic Stimulator Needle     Needle Length: 5cm  Needle Gauge: 22     Additional Needles:   Procedures:, nerve stimulator,,, ultrasound used (permanent image in chart),,,,  Narrative:  Start time: 09/24/2018 7:06 AM End time: 09/24/2018 7:10 AM Injection made incrementally with aspirations every 5 mL.  Performed by: Personally  Anesthesiologist: Janeece Riggers, MD  Additional Notes: Functioning IV was confirmed and monitors were applied.  A 82mm 22ga Arrow echogenic stimulator needle was used. Sterile prep and drape,hand hygiene and sterile gloves were used. Ultrasound guidance: relevant anatomy identified, needle position confirmed, local anesthetic spread visualized around nerve(s)., vascular puncture avoided.  Image printed for medical record. Negative aspiration and negative test dose prior to incremental administration of local anesthetic. The patient tolerated the procedure well.

## 2018-09-24 NOTE — Evaluation (Signed)
Physical Therapy Evaluation Patient Details Name: Amy OsmondVeronica F Werner MRN: [id number] DOB: [birth year] Today's Date: 09/24/2018   History of Present Illness  58 yo female s/p R TKA 09/24/18.  Clinical Impression  On eval POD 0, pt was Min guard-Min assist for mobility. She walked ~60 feet with a RW. Minimal pain with activity. Anticipate pt will progress well during stay. Plan is for d/c home with HHPT f/u.     Follow Up Recommendations Follow surgeon's recommendation for DC plan and follow-up therapies(HHPT)    Equipment Recommendations  None recommended by PT    Recommendations for Other Services       Precautions / Restrictions Precautions Precautions: Fall Restrictions Weight Bearing Restrictions: No Other Position/Activity Restrictions: WBAT      Mobility  Bed Mobility Overal bed mobility: Needs Assistance Bed Mobility: Supine to Sit     Supine to sit: Min guard     General bed mobility comments: close guard for safety, lines. Pt c/o lightheadedness that resolved enough to continue with mobility.  Transfers Overall transfer level: Needs assistance Equipment used: Rolling walker (2 wheeled) Transfers: Sit to/from Stand Sit to Stand: Min assist         General transfer comment: Assist to rise, stabilize, control descent. VCs safety, technique, hand/LE placement.  Ambulation/Gait Ambulation/Gait assistance: Min guard Gait Distance (Feet): 60 Feet Assistive device: Rolling walker (2 wheeled) Gait Pattern/deviations: Step-to pattern     General Gait Details: VCs safety, sequence. Close guard for safety. No further c/o lightheadedness.  Stairs            Wheelchair Mobility    Modified Rankin (Stroke Patients Only)       Balance Overall balance assessment: Needs assistance         Standing balance support: Bilateral upper extremity supported Standing balance-Leahy Scale: Poor                               Pertinent  Vitals/Pain Pain Assessment: 0-10 Pain Score: 5  Pain Location: R knee Pain Descriptors / Indicators: Sore Pain Intervention(s): Monitored during session;Ice applied    Home Living Family/patient expects to be discharged to:: Private residence Living Arrangements: Spouse/significant other   Type of Home: House Home Access: Stairs to enter Entrance Stairs-Rails: None Entrance Stairs-Number of Steps: 2 Home Layout: One level Home Equipment: Emergency planning/management officerhower seat;Walker - 2 wheels;Toilet riser      Prior Function Level of Independence: Independent               Hand Dominance        Extremity/Trunk Assessment   Upper Extremity Assessment Upper Extremity Assessment: Overall WFL for tasks assessed    Lower Extremity Assessment Lower Extremity Assessment: Generalized weakness(post op weakness 2* TKA)    Cervical / Trunk Assessment Cervical / Trunk Assessment: Normal  Communication   Communication: No difficulties  Cognition Arousal/Alertness: Awake/alert Behavior During Therapy: WFL for tasks assessed/performed Overall Cognitive Status: Within Functional Limits for tasks assessed                                        General Comments      Exercises     Assessment/Plan    PT Assessment Patient needs continued PT services  PT Problem List Decreased strength;Decreased mobility;Decreased range of motion;Decreased activity tolerance;Decreased balance;Decreased knowledge of use of DME;Pain  PT Treatment Interventions DME instruction;Gait training;Therapeutic exercise;Therapeutic activities;Patient/family education;Balance training;Functional mobility training    PT Goals (Current goals can be found in the Care Plan section)  Acute Rehab PT Goals Patient Stated Goal: regain PLOF PT Goal Formulation: With patient Time For Goal Achievement: 10/01/18 Potential to Achieve Goals: Good    Frequency 7X/week   Barriers to discharge         Co-evaluation               AM-PAC PT "6 Clicks" Mobility  Outcome Measure Help needed turning from your back to your side while in a flat bed without using bedrails?: A Little Help needed moving from lying on your back to sitting on the side of a flat bed without using bedrails?: A Little Help needed moving to and from a bed to a chair (including a wheelchair)?: A Little Help needed standing up from a chair using your arms (e.g., wheelchair or bedside chair)?: A Little Help needed to walk in hospital room?: A Little Help needed climbing 3-5 steps with a railing? : A Little 6 Click Score: 18    End of Session Equipment Utilized During Treatment: Gait belt Activity Tolerance: Patient tolerated treatment well Patient left: in chair;with call bell/phone within reach   PT Visit Diagnosis: Other abnormalities of gait and mobility (R26.89);Pain Pain - Right/Left: Right Pain - part of body: Knee    Time: 8563-1497 PT Time Calculation (min) (ACUTE ONLY): 19 min   Charges:   PT Evaluation $PT Eval Low Complexity: [place], PT Acute Rehabilitation Services Pager: [phone] Office: [phone]

## 2018-09-24 NOTE — Op Note (Signed)
MRN:     [id number] DOB/AGE:    1960-06-11 / 58 y.o.       OPERATIVE REPORT   DATE OF PROCEDURE:  09/24/2018      PREOPERATIVE DIAGNOSIS:   Primary Localized Osteoarthritis right Knee       Estimated body mass index is 33.02 kg/m as calculated from the following:   Height as of this encounter: 5\' 5"  (1.651 m).   Weight as of this encounter: 90 kg.                                                       POSTOPERATIVE DIAGNOSIS:   Same                                                                 PROCEDURE:  Procedure(s): TOTAL KNEE ARTHROPLASTY Using Depuy Attune RP implants #5 Femur, #5Tibia, 61mm(463Phi>147622m03(63Phi>147228m0966Phi>14735m40Phi>147352m19(61Phi>14731m3184Phi>147821m47(56Phi>147948m01(73Phi>14797m4934Phi>147664m0950Phi>147774m6298Phi>147348m03Phi>147(8767m3)71Phi>147270m31Phi>147(846m7345Phi>147745m06(36Phi>14781m13(561Phi>147482m0936Phi>147(615m26(321Phi>14757m64(34Phi>[id number], 32 Patella    SURGEON: Mazelle Huebert A. Kaliegh Willadsen, MD   ASSISTANT: Kirstin Shepperson, PA-C, present and scrubbed throughout the case, critical for retraction, instrumentation, and closure.  ANESTHESIA: Spinal with Adductor Nerve Block  TOURNIQUET TIME: 60 minutes   COMPLICATIONS:  None       SPECIMENS: None   INDICATIONS FOR PROCEDURE: The patient has djd of the knee with varus deformities, XR shows bone on bone arthritis. Patient has failed all conservative measures including anti-inflammatory medicines, narcotics, attempts at exercise and weight loss, cortisone injections and viscosupplementation.  Risks and benefits of surgery have been discussed, questions answered.    DESCRIPTION OF PROCEDURE: The patient identified by armband, received right adductor canal block and IV antibiotics, in the holding area at [facility]. Patient taken to the operating room, appropriate anesthetic monitors were attached. Spinal anesthesia induced with the patient in supine position, Foley catheter was inserted. Tourniquet applied high to the operative thigh. Lateral post and foot positioner applied to the table, the lower extremity was then prepped and draped in usual sterile fashion from the ankle to the tourniquet. Time-out procedure was performed. The limb was wrapped with an Esmarch bandage  and the tourniquet inflated to 365 mmHg.   We began the operation by making a 6cm anterior midline incision. Small bleeders in the skin and the subcutaneous tissue identified and cauterized. Transverse retinaculum was incised and reflected medially and a medial parapatellar arthrotomy was accomplished. the patella was everted and theprepatellar fat pad resected. The superficial medial collateral ligament was then elevated from anterior to posterior along the proximal flare of the tibia and anterior half of the menisci resected. The knee was hyperflexed exposing bone on bone arthritis. Peripheral and notch osteophytes as well as the cruciate ligaments were then resected. We continued to work our way around posteriorly along the proximal tibia, and externally rotated the tibia subluxing it out from underneath the femur. A McHale retractor was placed through the notch and a lateral Hohmann retractor placed, and an external tibial guide was placed.  The tibial cutting guide was pinned into place allowing resection of 4 mm of bone medially and about 6 mm of bone laterally because of her  varus deformity.   Satisfied with the tibial resection, we then entered the distal femur 2 mm anterior to the PCL origin with the intramedullary guide rod and applied the distal femoral cutting guide set at 65mm, with 5 degrees of valgus. This was pinned along the epicondylar axis. At this point, the distal femoral cut was accomplished without difficulty. We then sized for a 5 femoral component and pinned the guide in 3 degrees of external rotation.The chamfer cutting guide was pinned into place. The anterior, posterior, and chamfer cuts were accomplished without difficulty followed by the  RP box cutting guide and the box cut. We also removed posterior osteophytes from the posterior femoral condyles. At this time, the knee was brought into full extension. We checked our extension and flexion gaps and found them symmetric at 5.  The  patella thickness measured at 79m m. We set the cutting guide at 15 and removed the posterior patella sized for 32 button and drilled the lollipop. The knee was then once again hyperflexed exposing the proximal tibia. We sized for a # 5 tibial base plate, applied the smokestack and the conical reamer followed by the the Delta fin keel punch. We then hammered into place the  RP trial femoral component, inserted a trial bearing, trial patellar button, and took the knee through range of motion from 0-130 degrees. No thumb pressure was required for patellar tracking.   At this point, all trial components were removed, a double batch of DePuy HV cement  was mixed and applied to all bony metallic mating surfaces. In order, we hammered into place the tibial tray and removed excess cement, the femoral component and removed excess cement, a 5 mm  RP bearing was inserted, and the knee brought to full extension with compression. The patellar button was clamped into place, and excess cement removed. While the cement cured the wound was irrigated out with normal saline solution pulse lavage, and exparel was injected throughout the knee. Ligament stability and patellar tracking were checked and found to be excellent..   The parapatellar arthrotomy was closed with  #1 Vicryl suture. The subcutaneous tissue with 0 and 2-0 undyed Vicryl suture, and 4-0 Monocryl.. A dressing of Aquaseal, 4 x 4, dressing sponges, Webril, and Ace wrap applied. Needle and sponge count were correct times 2.The patient awakened, extubated, and taken to recovery room without difficulty. Vascular status was normal, pulses 2+ and symmetric.    [name] 06/26/2017, 8:56 AM

## 2018-09-25 LAB — BASIC METABOLIC PANEL
Anion gap: 9 (ref 5–15)
BUN: 11 mg/dL (ref 6–20)
CO2: 24 mmol/L (ref 22–32)
Calcium: 8.9 mg/dL (ref 8.9–10.3)
Chloride: 107 mmol/L (ref 98–111)
Creatinine, Ser: 0.55 mg/dL (ref 0.44–1.00)
GFR calc Af Amer: >60 mL/min (ref >60)
GFR calc non Af Amer: >60 mL/min (ref >60)
Glucose, Bld: 162 mg/dL — ABNORMAL HIGH (ref 70–99)
Potassium: 3.7 mmol/L (ref 3.5–5.1)
Sodium: 140 mmol/L (ref 135–145)

## 2018-09-25 LAB — CBC
HCT: 38.2 % (ref 36.0–46.0)
Hemoglobin: 12.7 g/dL (ref 12.0–15.0)
MCH: 30.9 pg (ref 26.0–34.0)
MCHC: 33.2 g/dL (ref 30.0–36.0)
MCV: 92.9 fL (ref 80.0–100.0)
Platelets: 233 10*3/uL (ref 150–400)
RBC: 4.11 MIL/uL (ref 3.87–5.11)
RDW: 12.9 % (ref 11.5–15.5)
WBC: 19.4 10*3/uL — ABNORMAL HIGH (ref 4.0–10.5)
nRBC: 0 % (ref 0.0–0.2)

## 2018-09-25 MED ORDER — GABAPENTIN 300 MG PO CAPS: ORAL_CAPSULE | ORAL | 0 refills | Status: DC

## 2018-09-25 MED ORDER — POLYETHYLENE GLYCOL 3350 17 G PO PACK: PACK | ORAL | 0 refills | Status: DC

## 2018-09-25 MED ORDER — OXYCODONE HCL 5 MG PO TABS: ORAL_TABLET | ORAL | 0 refills | Status: DC

## 2018-09-25 MED ORDER — DOCUSATE SODIUM 100 MG PO CAPS: ORAL_CAPSULE | ORAL | 0 refills | Status: DC

## 2018-09-25 MED ORDER — ASPIRIN 325 MG PO TBEC: 325.0000 mg | DELAYED_RELEASE_TABLET | Freq: Every day | ORAL | 0 refills | Status: DC

## 2018-09-25 NOTE — TOC Transition Note (Signed)
Transition of Care [facility]) - CM/SW Discharge Note   Patient Details  Name: NIJAE DOYEL MRN: [id number] Date of Birth: 28-Sep-1960  Transition of Care [facility]) CM/SW Contact:  Leeroy Cha, RN Phone Number: 09/25/2018, 10:31 AM   Clinical Narrative:    dcd to home with equip  [facility]   Final next level of care: Home w Home Health Services Barriers to Discharge: No Barriers Identified   Patient Goals and CMS Choice Patient states their goals for this hospitalization and ongoing recovery are:: just to get better CMS Medicare.gov Compare Post Acute Care list provided to:: Patient Choice offered to / list presented to : Patient  Discharge Placement                       Discharge Plan and Services   Discharge Planning Services: CM Consult Post Acute Care Choice: Durable Medical Equipment, Home Health          DME Arranged: CPM, Walker rolling, 3-N-1 DME Agency: [employer] Date DME Agency Contacted: 09/25/18 Time DME Agency Contacted: 0900 Representative spoke with at DME Agency: [facility] HH Arranged: PT [place]: [facility] [facility] [employer] Date [place]: 09/25/18 Time [place]: 0900 Representative spoke with at [place]: [facility] Interventions     Readmission Risk Interventions No flowsheet data found.

## 2018-09-25 NOTE — Progress Notes (Signed)
Physical Therapy Treatment Patient Details Name: [name] MRN: [id number] DOB: 07/17/1960 Today's Date: 09/25/2018    History of Present Illness 58 yo female s/p R TKA 09/24/18.    PT Comments    Pt ambulated in hallway and practiced safe stair technique.  Pt performed LE exercises and provided with HEP handout.  Pt feels ready for d/c home today.   Follow Up Recommendations  Follow surgeon's recommendation for DC plan and follow-up therapies     Equipment Recommendations  None recommended by PT    Recommendations for Other Services       Precautions / Restrictions Precautions Precautions: Fall;Knee Restrictions Other Position/Activity Restrictions: WBAT    Mobility  Bed Mobility               General bed mobility comments: pt up in recliner on arrival  Transfers Overall transfer level: Needs assistance Equipment used: Rolling walker (2 wheeled) Transfers: Sit to/from Stand Sit to Stand: Min guard         General transfer comment: verbal cues for UE and LE positioning  Ambulation/Gait Ambulation/Gait assistance: Min guard Gait Distance (Feet): 180 Feet Assistive device: Rolling walker (2 wheeled) Gait Pattern/deviations: Step-to pattern;Decreased stance time - right;Antalgic     General Gait Details: verbal cues for step length, RW positioning, posture   Stairs Stairs: Yes Stairs assistance: Min guard Stair Management: Step to pattern;Backwards;With walker Number of Stairs: 2 General stair comments: verbal cues for sequence, safety, RW positioning; pt performed twice and reports understanding   Wheelchair Mobility    Modified Rankin (Stroke Patients Only)       Balance                                            Cognition Arousal/Alertness: Awake/alert Behavior During Therapy: WFL for tasks assessed/performed Overall Cognitive Status: Within Functional Limits for tasks assessed                                         Exercises Total Joint Exercises Ankle Circles/Pumps: AROM;10 reps;Both Quad Sets: AROM;Both;10 reps Short Arc Quad: AROM;Right;10 reps Heel Slides: AAROM;Right;10 reps Hip ABduction/ADduction: AROM;Right;10 reps Straight Leg Raises: AROM;Right;10 reps    General Comments        Pertinent Vitals/Pain Pain Assessment: 0-10 Pain Score: 4  Pain Location: R knee Pain Descriptors / Indicators: Sore;Tightness;Aching Pain Intervention(s): Monitored during session;Limited activity within patient's tolerance;Repositioned;Ice applied    Home Living                      Prior Function            PT Goals (current goals can now be found in the care plan section) Progress towards PT goals: Progressing toward goals    Frequency    7X/week      PT Plan Current plan remains appropriate    Co-evaluation              AM-PAC PT "6 Clicks" Mobility   Outcome Measure  Help needed turning from your back to your side while in a flat bed without using bedrails?: A Little Help needed moving from lying on your back to sitting on the side of a flat bed without using bedrails?: A Little Help needed  moving to and from a bed to a chair (including a wheelchair)?: A Little Help needed standing up from a chair using your arms (Werner.g., wheelchair or bedside chair)?: A Little Help needed to walk in hospital room?: A Little Help needed climbing 3-5 steps with a railing? : A Little 6 Click Score: 18    End of Session   Activity Tolerance: Patient tolerated treatment well Patient left: with call bell/phone within reach;in bed Nurse Communication: Mobility status PT Visit Diagnosis: Other abnormalities of gait and mobility (R26.89)     Time: [id number] PT Time Calculation (min) (ACUTE ONLY): 23 min  Charges:  $Gait Training: 8-22 mins $Therapeutic Exercise: 8-22 mins                    Zenovia JarredKati Blessed Cotham, PT, DPT Acute Rehabilitation Services Office:  [phone] Pager: [phone]  [name],[name] 09/25/2018, 1:41 PM

## 2018-09-25 NOTE — Discharge Summary (Signed)
Inpatient admission precert number: [id number]   Patient ID: [name] MRN: [id number] DOB/AGE: November 09, 1960 58 y.o.  Admit date: 09/24/2018 Discharge date: 09/25/2018  Admission Diagnoses:  Active Problems:   Primary localized osteoarthritis of right knee   Discharge Diagnoses:  Same  Past Medical History:  Diagnosis Date  . Anxiety   . Complication of anesthesia   . Depression   . DJD (degenerative joint disease) of knee   . Elevated liver enzymes   . Gout   . Hot flashes   . Hyperlipidemia   . Hypertension   . Pneumonia   . PONV (postoperative nausea and vomiting)    Very per pt.  . Seasonal allergies   . Vitamin D deficiency     Surgeries: Procedure(s): TOTAL KNEE ARTHROPLASTY on 09/24/2018   Consultants:   Discharged Condition: Improved  Hospital Course: [name] is an 58 y.o. female who was admitted 09/24/2018 for operative treatment of<principal problem not specified>. Patient has severe unremitting pain that affects sleep, daily activities, and work/hobbies. After pre-op clearance the patient was taken to the operating room on 09/24/2018 and underwent  Procedure(s): TOTAL KNEE ARTHROPLASTY.    Patient was given perioperative antibiotics:  Anti-infectives (From admission, onward)   Start     Dose/Rate Route Frequency Ordered Stop   09/24/18 1400  ceFAZolin (ANCEF) IVPB 2g/100 mL premix     2 g 200 mL/hr over 30 Minutes Intravenous Every 6 hours 09/24/18 1045 09/24/18 2055   09/24/18 0630  ceFAZolin (ANCEF) IVPB 2g/100 mL premix     2 g 200 mL/hr over 30 Minutes Intravenous On call to O.R. 09/24/18 8295 09/24/18 6213       Patient was given sequential compression devices, early ambulation, and chemoprophylaxis to prevent DVT.  Patient benefited maximally from hospital stay and there were no complications.    Recent vital signs:  Patient Vitals for the past 24 hrs:  BP Temp Temp src Pulse Resp SpO2  09/25/18 0558 (!) 157/76 97.8 F  (36.6 C) Oral 76 18 96 %  09/25/18 0208 (!) 148/71 97.9 F (36.6 C) Oral 65 16 96 %  09/24/18 2244 130/66 97.6 F (36.4 C) Oral 62 18 99 %  09/24/18 1848 (!) 148/62 98.1 F (36.7 C) Oral 71 16 96 %  09/24/18 1353 (!) 120/53 98 F (36.7 C) - 67 14 95 %  09/24/18 1253 (!) 128/50 97.7 F (36.5 C) Oral 76 16 95 %  09/24/18 1156 129/66 98.4 F (36.9 C) Axillary 75 16 96 %  09/24/18 1053 - 98.2 F (36.8 C) - 70 - -  09/24/18 1052 132/67 98.2 F (36.8 C) Oral 70 15 95 %  09/24/18 1030 124/64 - - 74 15 99 %  09/24/18 1015 (!) 129/59 98.3 F (36.8 C) - 74 13 99 %  09/24/18 1000 (!) 127/57 - - 73 15 97 %  09/24/18 0949 (!) 131/53 98.3 F (36.8 C) - 82 18 98 %     Recent laboratory studies:  Recent Labs    09/25/18 0305  WBC 19.4*  HGB 12.7  HCT 38.2  PLT 233  NA 140  K 3.7  CL 107  CO2 24  BUN 11  CREATININE 0.55  GLUCOSE 162*  CALCIUM 8.9     Discharge Medications:   Allergies as of 09/25/2018      Reactions   Demerol  [meperidine Hcl] Nausea And Vomiting   Demerol [meperidine] Nausea And Vomiting   Statins    Muscle  and bone pain      Medication List    STOP taking these medications   Ultram 50 MG tablet Generic drug: traMADol     TAKE these medications   allopurinol 100 MG tablet Commonly known as: ZYLOPRIM TAKE 1 TABLET EVERY DAY   amLODipine 10 MG tablet Commonly known as: NORVASC TAKE 1 TABLET BY MOUTH DAILY   aspirin 325 MG EC tablet Take 1 tablet (325 mg total) by mouth daily with breakfast. 1 tablet a day for 30 days to prevent blood clots Start taking on: September 26, 2018 What changed:   medication strength  how much to take  when to take this  additional instructions   benazepril-hydrochlorthiazide 20-12.5 MG tablet Commonly known as: LOTENSIN HCT TAKE 2 TABLETS EVERY DAY MUST BE SEEN FOR MORE REFILLS What changed: See the new instructions.   Bystolic 10 MG tablet Generic drug: nebivolol TAKE 1/2 TABLET ONCE DAILY What changed:  how much to take   cloNIDine 0.2 MG tablet Commonly known as: CATAPRES Take 0.2 mg by mouth 2 (two) times daily.   docusate sodium 100 MG capsule Commonly known as: COLACE 1 tab 2 times a day while on narcotics.  STOOL SOFTENER   escitalopram 20 MG tablet Commonly known as: LEXAPRO Take 20 mg by mouth daily.   ezetimibe 10 MG tablet Commonly known as: ZETIA Take 1 tablet (10 mg total) by mouth daily.   gabapentin 300 MG capsule Commonly known as: NEURONTIN 1 po qhs for nerve pain   LORazepam 1 MG tablet Commonly known as: ATIVAN Take 1 mg by mouth 3 (three) times daily as needed for anxiety.   NONFORMULARY OR COMPOUNDED ITEM Apply 1 application topically daily as needed (pain). CBD Cream   oxyCODONE 5 MG immediate release tablet Commonly known as: Oxy IR/ROXICODONE 1 po q 4 hrs prn pain   polyethylene glycol 17 g packet Commonly known as: MIRALAX / GLYCOLAX 17grams in 16 oz of water twice a day until bowel movement.  LAXITIVE.  Restart if two days since last bowel movement   valACYclovir 1000 MG tablet Commonly known as: VALTREX as needed.   ZyrTEC Allergy 10 MG tablet Generic drug: cetirizine Take 10 mg by mouth at bedtime. For allergies            Discharge Care Instructions  (From admission, onward)         Start     Ordered   09/25/18 0000  Change dressing    Comments: Change the gauze dressing daily with sterile 4 x 4 inch gauze and apply TED hose.  DO NOT REMOVE BANDAGE OVER SURGICAL INCISION.  WASH WHOLE LEG INCLUDING OVER THE WATERPROOF BANDAGE WITH SOAP AND WATER EVERY DAY.   09/25/18 0830          Diagnostic Studies: Dg Chest 2 View  Result Date: 09/12/2018 CLINICAL DATA:  Pre-op respiratory exam for knee arthroplasty. EXAM: CHEST - 2 VIEW COMPARISON:  None. FINDINGS: The heart size and mediastinal contours are within normal limits. Both lungs are clear. The visualized skeletal structures are unremarkable. IMPRESSION: No active  cardiopulmonary disease. Electronically Signed   By: Myles RosenthalJohn  Stahl M.D.   On: 09/12/2018 16:36    Disposition: Discharge disposition: 01-Home or Self Care       Discharge Instructions    CPM   Complete by: As directed    Continuous passive motion machine (CPM):      Use the CPM from 0 to 90 for 6 hours  per day.       You may break it up into 2 or 3 sessions per day.      Use CPM for 2 weeks or until you are told to stop.   Call MD / Call 911   Complete by: As directed    If you experience chest pain or shortness of breath, CALL 911 and be transported to the hospital emergency room.  If you develope a fever above 101 F, pus (white drainage) or increased drainage or redness at the wound, or calf pain, call your surgeon's office.   Change dressing   Complete by: As directed    Change the gauze dressing daily with sterile 4 x 4 inch gauze and apply TED hose.  DO NOT REMOVE BANDAGE OVER SURGICAL INCISION.  WASH WHOLE LEG INCLUDING OVER THE WATERPROOF BANDAGE WITH SOAP AND WATER EVERY DAY.   Constipation Prevention   Complete by: As directed    Drink plenty of fluids.  Prune juice may be helpful.  You may use a stool softener, such as Colace (over the counter) 100 mg twice a day.  Use MiraLax (over the counter) for constipation as needed.   Diet - low sodium heart healthy   Complete by: As directed    Discharge instructions   Complete by: As directed    INSTRUCTIONS AFTER JOINT REPLACEMENT   DO NOT TAKE LOTENSIN (BENAZEPRIL/HCTZ) OR AMLODIPINE UNTIL BLOOD PRESSURE HIGHER THAN 140/90  GET UP AND WALK EVERY HOUR  USE BLUE BONE FOAM FOR 30 MINUTES 3-4 TIMES A DAY.  CPM IS ALWAYS AT 0-90    Remove items at home which could result in a fall. This includes throw rugs or furniture in walking pathways ICE to the affected joint every three hours while awake for 30 minutes at a time, for at least the first 3-5 days, and then as needed for pain and swelling.  Continue to use ice for pain and  swelling. You may notice swelling that will progress down to the foot and ankle.  This is normal after surgery.  Elevate your leg when you are not up walking on it.   Continue to use the breathing machine you got in the hospital (incentive spirometer) which will help keep your temperature down.  It is common for your temperature to cycle up and down following surgery, especially at night when you are not up moving around and exerting yourself.  The breathing machine keeps your lungs expanded and your temperature down.   DIET:  As you were doing prior to hospitalization, we recommend a well-balanced diet.  DRESSING / WOUND CARE / SHOWERING  Keep the surgical dressing until follow up.  The dressing is water proof, so you can shower without any extra covering.  IF THE DRESSING FALLS OFF or the wound gets wet inside, change the dressing with sterile gauze.  Please use good hand washing techniques before changing the dressing.  Do not use any lotions or creams on the incision until instructed by your surgeon.    ACTIVITY  Increase activity slowly as tolerated, but follow the weight bearing instructions below.   No driving for 6 weeks or until further direction given by your physician.  You cannot drive while taking narcotics.  No lifting or carrying greater than 10 lbs. until further directed by your surgeon. Avoid periods of inactivity such as sitting longer than an hour when not asleep. This helps prevent blood clots.  You may return to work once you  are authorized by your doctor.     WEIGHT BEARING   Weight bearing as tolerated with assist device (walker, cane, etc) as directed, use it as long as suggested by your surgeon or therapist, typically at least 2-3 weeks.   EXERCISES  Results after joint replacement surgery are often greatly improved when you follow the exercise, range of motion and muscle strengthening exercises prescribed by your doctor. Safety measures are also important to  protect the joint from further injury. Any time any of these exercises cause you to have increased pain or swelling, decrease what you are doing until you are comfortable again and then slowly increase them. If you have problems or questions, call your caregiver or physical therapist for advice.   Rehabilitation is important following a joint replacement. After just a few days of immobilization, the muscles of the leg can become weakened and shrink (atrophy).  These exercises are designed to build up the tone and strength of the thigh and leg muscles and to improve motion. Often times heat used for twenty to thirty minutes before working out will loosen up your tissues and help with improving the range of motion but do not use heat for the first two weeks following surgery (sometimes heat can increase post-operative swelling).   These exercises can be done on a training (exercise) mat, on the floor, on a table or on a bed. Use whatever works the best and is most comfortable for you.    Use music or television while you are exercising so that the exercises are a pleasant break in your day. This will make your life better with the exercises acting as a break in your routine that you can look forward to.   Perform all exercises about fifteen times, three times per day or as directed.  You should exercise both the operative leg and the other leg as well.   Exercises include:  Quad Sets - Tighten up the muscle on the front of the thigh (Quad) and hold for 5-10 seconds.   Straight Leg Raises - With your knee straight (if you were given a brace, keep it on), lift the leg to 60 degrees, hold for 3 seconds, and slowly lower the leg.  Perform this exercise against resistance later as your leg gets stronger.  Leg Slides: Lying on your back, slowly slide your foot toward your buttocks, bending your knee up off the floor (only go as far as is comfortable). Then slowly slide your foot back down until your leg is flat on  the floor again.  Angel Wings: Lying on your back spread your legs to the side as far apart as you can without causing discomfort.  Hamstring Strength:  Lying on your back, push your heel against the floor with your leg straight by tightening up the muscles of your buttocks.  Repeat, but this time bend your knee to a comfortable angle, and push your heel against the floor.  You may put a pillow under the heel to make it more comfortable if necessary.   A rehabilitation program following joint replacement surgery can speed recovery and prevent re-injury in the future due to weakened muscles. Contact your doctor or a physical therapist for more information on knee rehabilitation.    CONSTIPATION  Constipation is defined medically as fewer than three stools per week and severe constipation as less than one stool per week.  Even if you have a regular bowel pattern at home, your normal regimen is likely to  be disrupted due to multiple reasons following surgery.  Combination of anesthesia, postoperative narcotics, change in appetite and fluid intake all can affect your bowels.   YOU MUST use at least one of the following options; they are listed in order of increasing strength to get the job done.  They are all available over the counter, and you may need to use some, POSSIBLY even all of these options:    Drink plenty of fluids (prune juice may be helpful) and high fiber foods Colace 100 mg by mouth twice a day  Senokot for constipation as directed and as needed Dulcolax (bisacodyl), take with full glass of water  Miralax (polyethylene glycol) once or twice a day as needed.  If you have tried all these things and are unable to have a bowel movement in the first 3-4 days after surgery call either your surgeon or your primary doctor.    If you experience loose stools or diarrhea, hold the medications until you stool forms back up.  If your symptoms do not get better within 1 week or if they get worse,  check with your doctor.  If you experience "the worst abdominal pain ever" or develop nausea or vomiting, please contact the office immediately for further recommendations for treatment.   ITCHING:  If you experience itching with your medications, try taking only a single pain pill, or even half a pain pill at a time.  You can also use Benadryl over the counter for itching or also to help with sleep.   TED HOSE STOCKINGS:  Use stockings on both legs until for at least 2 weeks or as directed by physician office. They may be removed at night for sleeping.  MEDICATIONS:  See your medication summary on the "After Visit Summary" that nursing will review with you.  You may have some home medications which will be placed on hold until you complete the course of blood thinner medication.  It is important for you to complete the blood thinner medication as prescribed.  PRECAUTIONS:  If you experience chest pain or shortness of breath - call 911 immediately for transfer to the hospital emergency department.   If you develop a fever greater that 101 F, purulent drainage from wound, increased redness or drainage from wound, foul odor from the wound/dressing, or calf pain - CONTACT YOUR SURGEON.                                                   FOLLOW-UP APPOINTMENTS:  If you do not already have a post-op appointment, please call the office for an appointment to be seen by your surgeon.  Guidelines for how soon to be seen are listed in your "After Visit Summary", but are typically between 1-4 weeks after surgery.  OTHER INSTRUCTIONS:   Knee Replacement:  Do not place pillow under knee, focus on keeping the knee straight while resting. CPM instructions: 0-90 degrees, 2 hours in the morning, 2 hours in the afternoon, and 2 hours in the evening. Place foam block, curve side up under heel at all times except when in CPM or when walking.  DO NOT modify, tear, cut, or change the foam block in any way.  MAKE SURE  YOU:  Understand these instructions.  Get help right away if you are not doing well or get worse.  Thank you for letting us be a part of your medical care team.  It is a privilege we respect greatly.  We hope these instructions will help you stay on track for a fast and full recovery!   Do not put a pillow under the knee. Place it under the heel.   Complete by: As directed    Place gray foam block, curve side up under heel at all times except when in CPM or when walking.  DO NOT modify, tear, cut, or change in any way the gray foam block.   Increase activity slowly as tolerated   Complete by: As directed    Patient may shower   Complete by: As directed    Aquacel dressing is water proof    Wash over it and the whole leg with soap and water at the end of your shower   TED hose   Complete by: As directed    Use stockings (TED hose) for 2 weeks on both leg(s).  You may remove them at night for sleeping.      Follow-up Information    [facility] Follow up on 10/08/2018.   Specialty: Physical Therapy Why: arrive at 10:45 for 11 am physical therapy  Contact information: [facility] [address] [place] [address] [phone]        Salvatore MarvelWainer, Robert, MD Follow up on 10/08/2018.   Specialty: Orthopedic Surgery Why: appointment time 4 pm Contact information: [address] [place] [address] [phone]            Signed: Pascal LuxKirstin J Antolin Belsito 09/25/2018, 8:37 AM

## 2018-09-25 NOTE — Plan of Care (Signed)
Patient has met requirements for discharge. Reviewed discharge instructions, medications and education. Patient verbalizes understanding and has no further questions. IV removed.

## 2018-09-27 ENCOUNTER — Telehealth: Payer: Self-pay | Admitting: *Deleted

## 2018-09-27 ENCOUNTER — Other Ambulatory Visit: Payer: Self-pay | Admitting: Physician Assistant

## 2018-09-27 ENCOUNTER — Other Ambulatory Visit: Payer: Self-pay

## 2018-09-27 DIAGNOSIS — Z20822 Contact with and (suspected) exposure to covid-19: Secondary | ICD-10-CM

## 2018-09-27 NOTE — Telephone Encounter (Signed)
Contacted pt to schedule test; pt accepted appointment at [facility] site 09/27/2018 at 1100; pt given address, location, and instructions that she and all occupants of her vehicle should wear masks; she verbalized understanding; orders placed per protocol.

## 2018-09-27 NOTE — Telephone Encounter (Signed)
Mamie Levers, Surgical Coordinator called on behalf of Dr Elsie Saas called to have the pt tested for COVID due to post op surgery with increased temperature, and decreased sats; pt DOB, address, and contact phone [phone] verified; will attempt to contact pt.

## 2018-09-27 NOTE — Progress Notes (Unsigned)
Patient is 4 days post op from total knee.  She be running fevers yesterday.  FEVER 100.7   O2 saT 94% ON ROOM AIR.

## 2018-09-28 ENCOUNTER — Encounter (HOSPITAL_COMMUNITY): Payer: Self-pay | Admitting: Orthopedic Surgery

## 2018-10-01 ENCOUNTER — Other Ambulatory Visit: Payer: Self-pay | Admitting: Physician Assistant

## 2018-10-01 ENCOUNTER — Other Ambulatory Visit: Payer: Self-pay

## 2018-10-01 ENCOUNTER — Ambulatory Visit (HOSPITAL_COMMUNITY)
Admission: RE | Admit: 2018-10-01 | Discharge: 2018-10-01 | Disposition: A | Discharge status: Home / Self Care | Payer: 59 | Source: Ambulatory Visit | Attending: Physician Assistant | Admitting: Physician Assistant

## 2018-10-01 DIAGNOSIS — M7989 Other specified soft tissue disorders: Secondary | ICD-10-CM | POA: Diagnosis present

## 2018-10-01 DIAGNOSIS — M79604 Pain in right leg: Secondary | ICD-10-CM | POA: Insufficient documentation

## 2018-10-01 NOTE — Progress Notes (Signed)
Patient is 2 weeks s/p right total knee with increase pain and swelling in right lower leg

## 2018-10-01 NOTE — Progress Notes (Signed)
VASCULAR LAB PRELIMINARY  PRELIMINARY  PRELIMINARY  PRELIMINARY  Right lower extremity venous duplex completed.    Preliminary report:  See CV proc for preliminary results.   Wilhelmenia Addis, RVT 10/01/2018, 5:46 PM

## 2018-10-02 LAB — NOVEL CORONAVIRUS, NAA: SARS-CoV-2, NAA: NOT DETECTED

## 2018-12-05 LAB — LIPID PANEL
Chol/HDL Ratio: 4.8 ratio — ABNORMAL HIGH (ref 0.0–4.4)
Cholesterol, Total: 252 mg/dL — ABNORMAL HIGH (ref 100–199)
HDL: 52 mg/dL (ref >39)
LDL Chol Calc (NIH): 169 mg/dL — ABNORMAL HIGH (ref 0–99)
Triglycerides: 170 mg/dL — ABNORMAL HIGH (ref 0–149)
VLDL Cholesterol Cal: 31 mg/dL (ref 5–40)

## 2018-12-06 NOTE — Progress Notes (Signed)
Please do Pa for repatha and or praluent per AK

## 2018-12-06 NOTE — Progress Notes (Signed)
Can you annotate please

## 2018-12-06 NOTE — Progress Notes (Signed)
We can try to get PA for Repatha since she has not had response to Zetia. Other option would be potentially be Nexletol, but don't know if we could get coverage for this either.

## 2018-12-06 NOTE — Progress Notes (Signed)
Lvm for pt TO CALL BACK

## 2018-12-06 NOTE — Progress Notes (Signed)
Lipids are markedly elevated, did she start Zetia?

## 2018-12-06 NOTE — Progress Notes (Signed)
Pt says she is taking zetia, and says its hereditary. Pt says they deined repatha and praluent  from pcp.

## 2018-12-12 ENCOUNTER — Telehealth: Payer: Self-pay

## 2018-12-12 MED ORDER — REPATHA SURECLICK 140 MG/ML ~~LOC~~ SOAJ: 140.0000 mg | SUBCUTANEOUS | 3 refills | Status: DC

## 2018-12-12 NOTE — Telephone Encounter (Signed)
Please send in either praluent or repatha to pharmacy; The only way I can get PA started

## 2018-12-18 ENCOUNTER — Ambulatory Visit (INDEPENDENT_AMBULATORY_CARE_PROVIDER_SITE_OTHER): Payer: 59 | Admitting: Cardiology

## 2018-12-18 ENCOUNTER — Other Ambulatory Visit: Payer: Self-pay

## 2018-12-18 ENCOUNTER — Encounter: Payer: Self-pay | Admitting: Cardiology

## 2018-12-18 VITALS — BP 141/61 | HR 57 | Ht 66.0 in | Wt 199.0 lb

## 2018-12-18 DIAGNOSIS — I10 Essential (primary) hypertension: Secondary | ICD-10-CM

## 2018-12-18 DIAGNOSIS — R9431 Abnormal electrocardiogram [ECG] [EKG]: Secondary | ICD-10-CM | POA: Diagnosis not present

## 2018-12-18 DIAGNOSIS — E782 Mixed hyperlipidemia: Secondary | ICD-10-CM

## 2018-12-18 MED ORDER — LIVALO 2 MG PO TABS: 1.0000 | ORAL_TABLET | Freq: Every day | ORAL | 1 refills | Status: DC

## 2018-12-18 NOTE — Progress Notes (Signed)
Primary Physician:  Norm ParcelJolly, Thomas L., MD   Patient ID: [name], female    DOB: May 09, 1960, 58 y.o.   MRN: [id number]  Subjective:    Chief Complaint  Patient presents with  . Hyperlipidemia  . Abnormal ECG  . LAB RESULTS    HPI: [name]  is a 58 y.o. female  with hypertension, hyperlipidemia, recently evaluated for perioperative cardiovascular risk stratification and abnormal EKG.  She is still able to do short distance walking without any difficulty. Has right knee pain that limits her activities. She is able to climb stairs daily at home without difficulty. No chest pain, shortness of breath, leg swelling, palpitations, PND or orthopnea. No symptoms suggestive of claudication or TIA.   Has had hypertension since pregnancy with her 243 year old son. States that her blood pressure has been fairly well controlled with her current medications. Does report that cholesterol is uncontrolled. She has had myalgia with statin. She tolerated Repatha, but unable to get insurance approval. She was started on Zetia at her last visit.   Denies any previous cardiac workup. Denies any family history of MI or CVA. Former smoker that quit in 2005.   Past Medical History:  Diagnosis Date  . Anxiety   . Complication of anesthesia   . Depression   . DJD (degenerative joint disease) of knee   . Elevated liver enzymes   . Gout   . Hot flashes   . Hyperlipidemia   . Hypertension   . Pneumonia   . PONV (postoperative nausea and vomiting)    Very per pt.  . Seasonal allergies   . Vitamin D deficiency     Past Surgical History:  Procedure Laterality Date  . CARPAL TUNNEL RELEASE     right  . CESAREAN SECTION  1990  . COLONOSCOPY    . KNEE ARTHROSCOPY     right x 2  . TOTAL KNEE ARTHROPLASTY Right 09/24/2018   Procedure: TOTAL KNEE ARTHROPLASTY;  Surgeon: Salvatore MarvelWainer, Robert, MD;  Location: [facility];  Service: Orthopedics;  Laterality: Right;    Social History    Socioeconomic History  . Marital status: Married    Spouse name: Not on file  . Number of children: 1  . Years of education: Not on file  . Highest education level: Not on file  Occupational History  . Not on file  Social Needs  . Financial resource strain: Not on file  . Food insecurity    Worry: Not on file    Inability: Not on file  . Transportation needs    Medical: Not on file    Non-medical: Not on file  Tobacco Use  . Smoking status: Former Smoker    Packs/day: 1.00    Years: 18.00    Pack years: 18.00    Quit date: 03/05/2004    Years since quitting: 14.8  . Smokeless tobacco: Never Used  Substance and Sexual Activity  . Alcohol use: No  . Drug use: No  . Sexual activity: Yes  Lifestyle  . Physical activity    Days per week: Not on file    Minutes per session: Not on file  . Stress: Not on file  Relationships  . Social Musicianconnections    Talks on phone: Not on file    Gets together: Not on file    Attends religious service: Not on file    Active member of club or organization: Not on file    Attends meetings  of clubs or organizations: Not on file    Relationship status: Not on file  . Intimate partner violence    Fear of current or ex partner: Not on file    Emotionally abused: Not on file    Physically abused: Not on file    Forced sexual activity: Not on file  Other Topics Concern  . Not on file  Social History Narrative  . Not on file    Review of Systems  Constitution: Negative for decreased appetite, malaise/fatigue, weight gain and weight loss.  Eyes: Negative for visual disturbance.  Cardiovascular: Negative for chest pain, claudication, dyspnea on exertion, leg swelling, orthopnea, palpitations and syncope.  Respiratory: Negative for hemoptysis and wheezing.   Endocrine: Negative for cold intolerance and heat intolerance.  Hematologic/Lymphatic: Does not bruise/bleed easily.  Skin: Negative for nail changes.  Musculoskeletal: Positive for joint  pain. Negative for muscle weakness and myalgias. Gout: right knee.  Gastrointestinal: Negative for abdominal pain, change in bowel habit, nausea and vomiting.  Neurological: Negative for difficulty with concentration, dizziness, focal weakness and headaches.  Psychiatric/Behavioral: Negative for altered mental status and suicidal ideas.  All other systems reviewed and are negative.     Objective:  Blood pressure (!) 141/61, pulse (!) 57, height 5\' 6"  (1.676 m), weight 199 lb (90.3 kg), SpO2 97 %. Body mass index is 32.12 kg/m.    Physical Exam  Constitutional: She is oriented to person, place, and time. Vital signs are normal. She appears well-developed and well-nourished.  HENT:  Head: Normocephalic and atraumatic.  Neck: Normal range of motion.  Cardiovascular: Normal rate, regular rhythm and intact distal pulses.  Murmur heard.  Early systolic murmur is present with a grade of 1/6 at the upper right sternal border. Pulmonary/Chest: Effort normal and breath sounds normal. No accessory muscle usage. No respiratory distress.  Abdominal: Soft. Bowel sounds are normal.  Musculoskeletal: Normal range of motion.  Neurological: She is alert and oriented to person, place, and time.  Skin: Skin is warm and dry.  Vitals reviewed.  Radiology: No results found.  Laboratory examination:    CMP Latest Ref Rng & Units 09/25/2018 09/12/2018 06/27/2013  Glucose 70 - 99 mg/dL 253(G) 82 -  BUN 6 - 20 mg/dL 11 14 -  Creatinine 6.44 - 1.00 mg/dL 0.34 7.42 -  Sodium 595 - 145 mmol/L 140 139 -  Potassium 3.5 - 5.1 mmol/L 3.7 3.3(L) -  Chloride 98 - 111 mmol/L 107 103 -  CO2 22 - 32 mmol/L 24 27 -  Calcium 8.9 - 10.3 mg/dL 8.9 9.1 -  Total Protein 6.5 - 8.1 g/dL - 7.3 6.9  Total Bilirubin 0.3 - 1.2 mg/dL - 6.3(O) 0.4  Alkaline Phos 38 - 126 U/L - 99 104  AST 15 - 41 U/L - 45(H) 39  ALT 0 - 44 U/L - 79(H) 69(H)   CBC Latest Ref Rng & Units 09/25/2018 09/12/2018  WBC 4.0 - 10.5 K/uL 19.4(H) 10.5   Hemoglobin 12.0 - 15.0 g/dL 75.6 15.2(H)  Hematocrit 36.0 - 46.0 % 38.2 45.1  Platelets 150 - 400 K/uL 233 279   Lipid Panel     Component Value Date/Time   CHOL 252 (H) 12/04/2018 1001   TRIG 170 (H) 12/04/2018 1001   HDL 52 12/04/2018 1001   CHOLHDL 4.8 (H) 12/04/2018 1001   CHOLHDL 4.2 09/26/2012 1206   VLDL 46 (H) 09/26/2012 1206   LDLCALC 126 (H) 06/12/2013 0935   HEMOGLOBIN A1C No results found for:  HGBA1C, MPG TSH No results for input(s): TSH in the last 8760 hours.  PRN Meds:. Medications Discontinued During This Encounter  Medication Reason  . docusate sodium (COLACE) 100 MG capsule Error  . gabapentin (NEURONTIN) 300 MG capsule Error  . NONFORMULARY OR COMPOUNDED ITEM Error  . oxyCODONE (OXY IR/ROXICODONE) 5 MG immediate release tablet Error  . polyethylene glycol (MIRALAX / GLYCOLAX) 17 g packet Error   Current Meds  Medication Sig  . allopurinol (ZYLOPRIM) 100 MG tablet TAKE 1 TABLET EVERY DAY  . amLODipine (NORVASC) 10 MG tablet TAKE 1 TABLET BY MOUTH DAILY  . aspirin EC 325 MG EC tablet Take 1 tablet (325 mg total) by mouth daily with breakfast. 1 tablet a day for 30 days to prevent blood clots (Patient taking differently: Take 81 mg by mouth daily with breakfast. 1 tablet a day for 30 days to prevent blood clots)  . benazepril-hydrochlorthiazide (LOTENSIN HCT) 20-12.5 MG per tablet TAKE 2 TABLETS EVERY DAY MUST BE SEEN FOR MORE REFILLS (Patient taking differently: Take 2 tablets by mouth daily. )  . BYSTOLIC 10 MG tablet TAKE 1/2 TABLET ONCE DAILY (Patient taking differently: Take 5 mg by mouth daily. )  . cetirizine (ZYRTEC ALLERGY) 10 MG tablet Take 10 mg by mouth at bedtime. For allergies  . cloNIDine (CATAPRES) 0.2 MG tablet Take 0.2 mg by mouth 2 (two) times daily.  . diclofenac (VOLTAREN) 50 MG EC tablet TAKE 1 TABLET BY MOUTH TWICE A DAY WITH FOOD AS NEEDED FOR PAIN AND SWELLING  . escitalopram (LEXAPRO) 20 MG tablet Take 20 mg by mouth daily.   .  Evolocumab (REPATHA SURECLICK) 140 MG/ML SOAJ Inject 140 mg into the skin every 14 (fourteen) days.  Marland Kitchen. ezetimibe (ZETIA) 10 MG tablet Take 1 tablet (10 mg total) by mouth daily.  Marland Kitchen. LORazepam (ATIVAN) 1 MG tablet Take 1 mg by mouth 3 (three) times daily as needed for anxiety.   . valACYclovir (VALTREX) 1000 MG tablet as needed.    Cardiac Studies:   Echo 09/18/2018:  1. The left ventricle has normal systolic function with an ejection fraction of 60-65%. The cavity size was normal. Left ventricular diastolic Doppler parameters are consistent with impaired relaxation.  2. The right ventricle has normal systolic function. The cavity was normal. There is no increase in right ventricular wall thickness.  3. Left atrial size was mildly dilated.  4. The aortic valve is tricuspid. Mild thickening of the aortic valve. Mild calcification of the aortic valve.  Assessment:   1. Mixed hyperlipidemia   2. Abnormal EKG   3. Essential hypertension     EKG 09/17/2018: Sinus bradycardia at 56 bpm, normal axis, diffuse nonspecific T wave inversion/flattening. Abnormal EKG.  Recommendations:   Patient has recently undergone knee surgery without any perioperative complications.  She is recovering well.  She was started on Zetia at her last office visit for hyperlipidemia, she has been compliant and is tolerating medication well.  I discussed recently obtain lipids, unfortunately her lipids continue to be markedly elevated despite being on Zetia.  She previously had success with Repatha, but has been unable to get approval for this.  We will try to see if we can obtain prior authorization.  She has been intolerant to multiple statins in the past due to myalgias symptoms.  It does not appear that she has tried Livalo, I have given her samples of Livalo to try to see if she can tolerate this.  Her blood pressure is minimally  elevated today, but she has been monitoring at home and is generally very well controlled,  will continue monitoring this.  No changes were made to her medications today.  Preoperative work-up including echocardiogram showed normal LVEF with mild aortic valve calcification.  Suspect her aortic murmur is a flow murmur.  I have reassured her.  I will see her back in 8 weeks for follow-up and hopefully in between that time, will be able to find out if she can have approval for Repatha.    Miquel Dunn, MSN, APRN, FNP-C [facility]. [place] Office: [phone] Fax: [phone]

## 2018-12-21 ENCOUNTER — Encounter: Payer: Self-pay | Admitting: Cardiology

## 2018-12-28 ENCOUNTER — Other Ambulatory Visit: Payer: Self-pay

## 2018-12-28 MED ORDER — LIVALO 2 MG PO TABS: 1.0000 | ORAL_TABLET | Freq: Every day | ORAL | 1 refills | Status: DC

## 2019-01-01 ENCOUNTER — Other Ambulatory Visit: Payer: Self-pay

## 2019-01-01 ENCOUNTER — Telehealth: Payer: Self-pay

## 2019-01-01 DIAGNOSIS — E7800 Pure hypercholesterolemia, unspecified: Secondary | ICD-10-CM

## 2019-01-01 MED ORDER — LIVALO 2 MG PO TABS: 2.0000 mg | ORAL_TABLET | Freq: Every day | ORAL | 3 refills | Status: DC

## 2019-02-20 NOTE — Telephone Encounter (Signed)
error 

## 2019-03-19 ENCOUNTER — Ambulatory Visit: Payer: 59 | Admitting: Cardiology

## 2020-10-24 IMAGING — DX CHEST - 2 VIEW
2 series · 2 of 2 positions shown · non-contrast
Comparison: None.

CLINICAL DATA: Pre-op respiratory exam for knee arthroplasty.

EXAM:
CHEST - 2 VIEW

[chest pa]
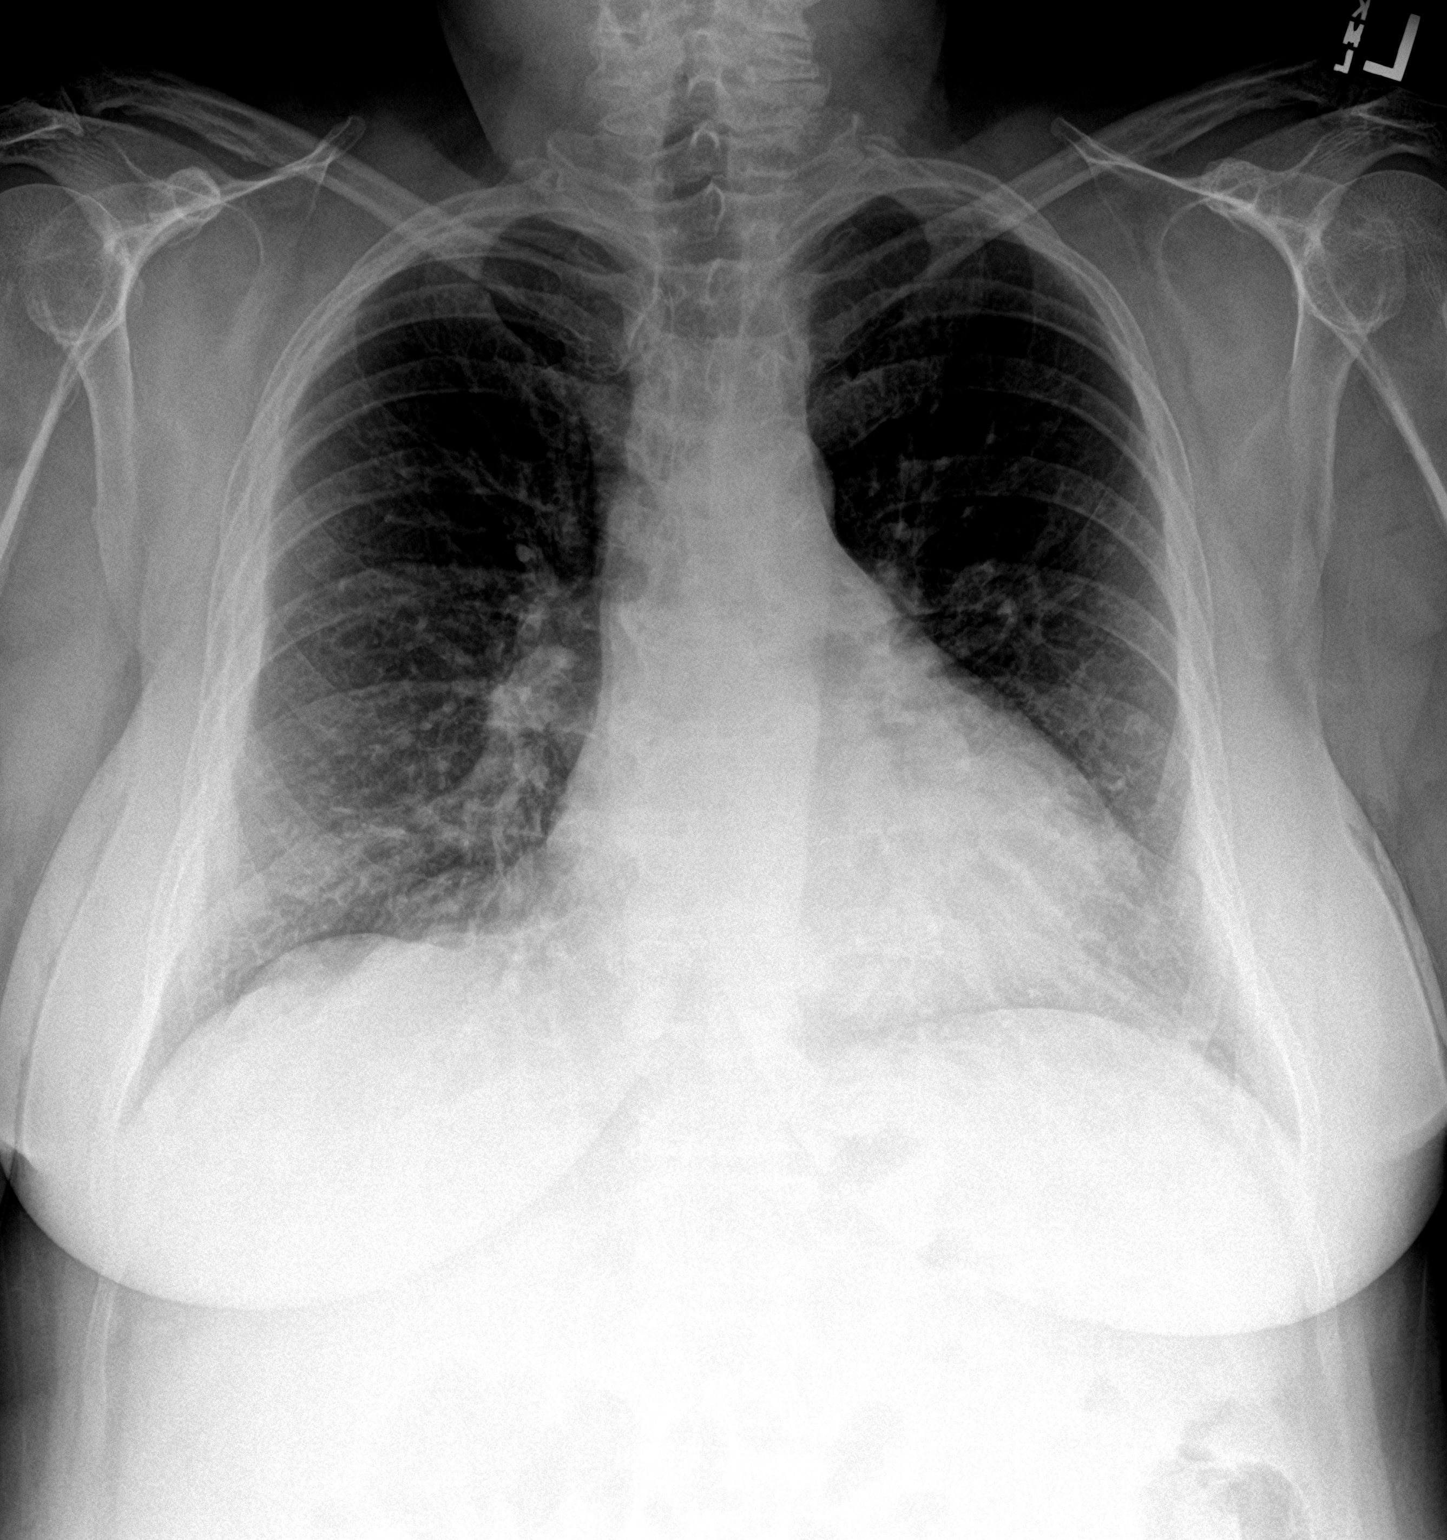

[chest lat]
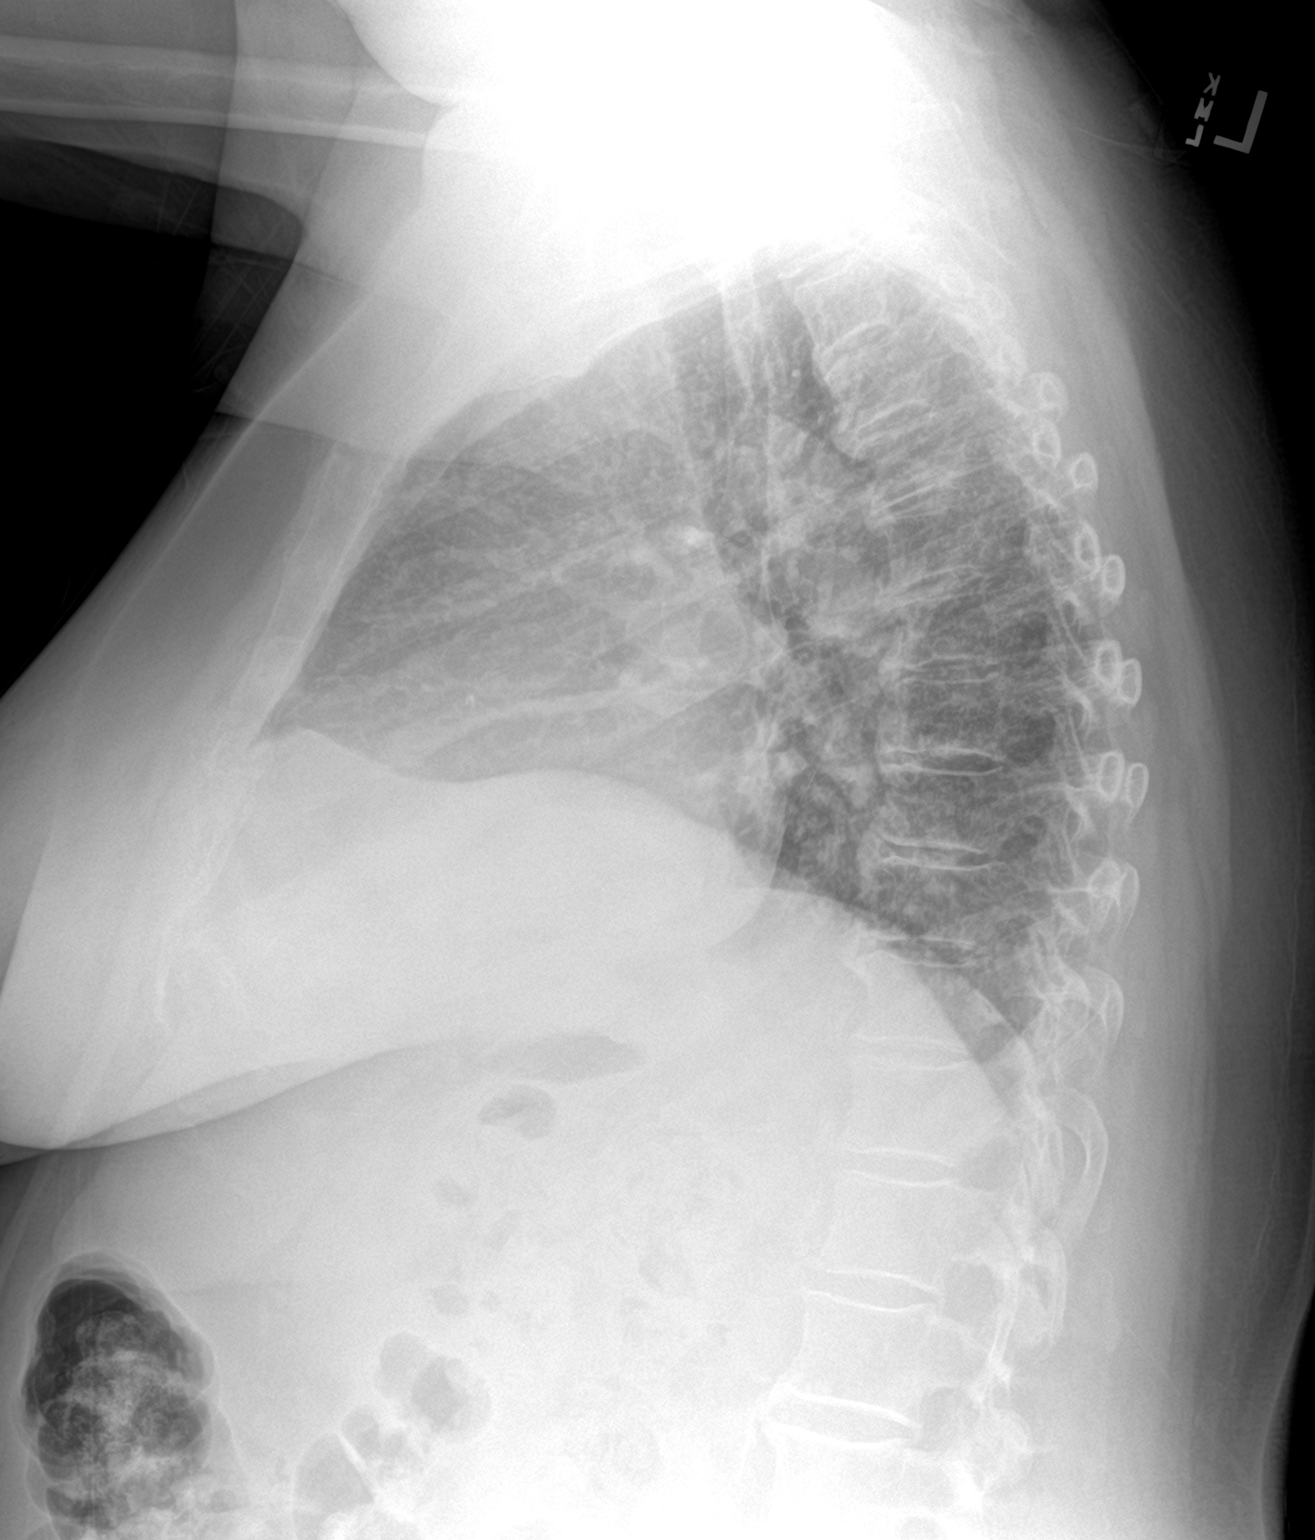

[2 of 2 positions shown; findings below may reference images not displayed]

FINDINGS: The heart size and mediastinal contours are within normal limits.
Both lungs are clear. The visualized skeletal structures are
unremarkable.
IMPRESSION: No active cardiopulmonary disease.

## 2020-11-12 DIAGNOSIS — G4733 Obstructive sleep apnea (adult) (pediatric): Secondary | ICD-10-CM | POA: Insufficient documentation

## 2020-11-12 DIAGNOSIS — R001 Bradycardia, unspecified: Secondary | ICD-10-CM | POA: Insufficient documentation

## 2022-01-31 DIAGNOSIS — I358 Other nonrheumatic aortic valve disorders: Secondary | ICD-10-CM | POA: Insufficient documentation

## 2022-01-31 DIAGNOSIS — E1169 Type 2 diabetes mellitus with other specified complication: Secondary | ICD-10-CM | POA: Insufficient documentation

## 2022-01-31 DIAGNOSIS — E119 Type 2 diabetes mellitus without complications: Secondary | ICD-10-CM | POA: Insufficient documentation

## 2022-03-01 DIAGNOSIS — R42 Dizziness and giddiness: Secondary | ICD-10-CM | POA: Insufficient documentation

## 2022-03-08 DIAGNOSIS — F419 Anxiety disorder, unspecified: Secondary | ICD-10-CM | POA: Insufficient documentation

## 2022-03-08 DIAGNOSIS — E7801 Familial hypercholesterolemia: Secondary | ICD-10-CM | POA: Insufficient documentation

## 2022-03-08 DIAGNOSIS — F410 Panic disorder [episodic paroxysmal anxiety] without agoraphobia: Secondary | ICD-10-CM | POA: Insufficient documentation

## 2022-04-26 LAB — HM DIABETES EYE EXAM

## 2022-07-12 ENCOUNTER — Other Ambulatory Visit: Payer: Self-pay | Admitting: Family Medicine

## 2022-07-12 DIAGNOSIS — Z1231 Encounter for screening mammogram for malignant neoplasm of breast: Secondary | ICD-10-CM

## 2022-07-18 ENCOUNTER — Inpatient Hospital Stay: Admission: RE | Admit: 2022-07-18 | Payer: 59 | Source: Ambulatory Visit

## 2022-07-27 ENCOUNTER — Ambulatory Visit
Admission: RE | Admit: 2022-07-27 | Discharge: 2022-07-27 | Disposition: A | Discharge status: Home / Self Care | Payer: 59 | Source: Ambulatory Visit | Attending: Family Medicine | Admitting: Family Medicine

## 2022-07-27 ENCOUNTER — Other Ambulatory Visit: Payer: Self-pay | Admitting: Family Medicine

## 2022-07-27 DIAGNOSIS — Z1231 Encounter for screening mammogram for malignant neoplasm of breast: Secondary | ICD-10-CM

## 2022-08-12 ENCOUNTER — Encounter: Payer: Self-pay | Admitting: Family Medicine

## 2022-08-12 ENCOUNTER — Ambulatory Visit: Payer: 59 | Admitting: Family Medicine

## 2022-08-12 ENCOUNTER — Other Ambulatory Visit: Payer: Self-pay | Admitting: Family Medicine

## 2022-08-12 VITALS — BP 90/48 | HR 48 | Temp 98.7°F | Ht 66.0 in | Wt 169.0 lb

## 2022-08-12 DIAGNOSIS — R011 Cardiac murmur, unspecified: Secondary | ICD-10-CM | POA: Diagnosis not present

## 2022-08-12 DIAGNOSIS — Z7984 Long term (current) use of oral hypoglycemic drugs: Secondary | ICD-10-CM

## 2022-08-12 DIAGNOSIS — F419 Anxiety disorder, unspecified: Secondary | ICD-10-CM

## 2022-08-12 DIAGNOSIS — E785 Hyperlipidemia, unspecified: Secondary | ICD-10-CM | POA: Diagnosis not present

## 2022-08-12 DIAGNOSIS — R944 Abnormal results of kidney function studies: Secondary | ICD-10-CM

## 2022-08-12 DIAGNOSIS — K219 Gastro-esophageal reflux disease without esophagitis: Secondary | ICD-10-CM

## 2022-08-12 DIAGNOSIS — E119 Type 2 diabetes mellitus without complications: Secondary | ICD-10-CM

## 2022-08-12 DIAGNOSIS — E1159 Type 2 diabetes mellitus with other circulatory complications: Secondary | ICD-10-CM

## 2022-08-12 DIAGNOSIS — I152 Hypertension secondary to endocrine disorders: Secondary | ICD-10-CM | POA: Diagnosis not present

## 2022-08-12 DIAGNOSIS — G4733 Obstructive sleep apnea (adult) (pediatric): Secondary | ICD-10-CM

## 2022-08-12 DIAGNOSIS — E1169 Type 2 diabetes mellitus with other specified complication: Secondary | ICD-10-CM | POA: Diagnosis not present

## 2022-08-12 DIAGNOSIS — M5432 Sciatica, left side: Secondary | ICD-10-CM | POA: Diagnosis not present

## 2022-08-12 DIAGNOSIS — J302 Other seasonal allergic rhinitis: Secondary | ICD-10-CM | POA: Diagnosis not present

## 2022-08-12 DIAGNOSIS — Z8639 Personal history of other endocrine, nutritional and metabolic disease: Secondary | ICD-10-CM

## 2022-08-12 DIAGNOSIS — E559 Vitamin D deficiency, unspecified: Secondary | ICD-10-CM

## 2022-08-12 DIAGNOSIS — F331 Major depressive disorder, recurrent, moderate: Secondary | ICD-10-CM | POA: Diagnosis not present

## 2022-08-12 LAB — BAYER DCA HB A1C WAIVED: HB A1C (BAYER DCA - WAIVED): 6 % — ABNORMAL HIGH (ref 4.8–5.6)

## 2022-08-12 MED ORDER — PREDNISONE 20 MG PO TABS
40.0000 mg | ORAL_TABLET | Freq: Every day | ORAL | 0 refills | Status: AC
Start: 2022-08-12 — End: 2022-08-17

## 2022-08-12 MED ORDER — OMEPRAZOLE 10 MG PO CPDR
10.0000 mg | DELAYED_RELEASE_CAPSULE | Freq: Every day | ORAL | 1 refills | Status: DC
Start: 2022-08-12 — End: 2022-10-20

## 2022-08-12 MED ORDER — CETIRIZINE HCL 10 MG PO TABS
10.00 mg | ORAL_TABLET | Freq: Every day | ORAL | 0 refills | Status: DC
Start: 2022-08-12 — End: 2022-10-11

## 2022-08-12 MED ORDER — ESCITALOPRAM OXALATE 20 MG PO TABS
20.0000 mg | ORAL_TABLET | Freq: Every day | ORAL | 0 refills | Status: DC
Start: 2022-08-12 — End: 2023-03-14

## 2022-08-12 MED ORDER — METOPROLOL SUCCINATE ER 100 MG PO TB24: 100.0000 mg | ORAL_TABLET | Freq: Every day | ORAL | 0 refills | Status: DC

## 2022-08-12 MED ORDER — METFORMIN HCL 500 MG PO TABS
500.0000 mg | ORAL_TABLET | Freq: Two times a day (BID) | ORAL | 0 refills | Status: DC
Start: 2022-08-12 — End: 2022-10-11

## 2022-08-12 MED ORDER — FENOFIBRATE 145 MG PO TABS
145.0000 mg | ORAL_TABLET | Freq: Every day | ORAL | 1 refills | Status: DC
Start: 2022-08-12 — End: 2023-05-16

## 2022-08-12 MED ORDER — NIFEDIPINE ER OSMOTIC RELEASE 90 MG PO TB24
90.0000 mg | ORAL_TABLET | Freq: Every day | ORAL | 1 refills | Status: AC
Start: 2022-08-12 — End: 2022-10-11

## 2022-08-12 MED ORDER — ASPIRIN 81 MG PO TBEC: 81.0000 mg | DELAYED_RELEASE_TABLET | Freq: Every day | ORAL | 0 refills | Status: AC

## 2022-08-12 MED ORDER — SPIRONOLACTONE 25 MG PO TABS: 25.0000 mg | ORAL_TABLET | Freq: Every day | ORAL | 0 refills | Status: DC

## 2022-08-12 MED ORDER — BENAZEPRIL HCL 40 MG PO TABS
40.0000 mg | ORAL_TABLET | Freq: Every day | ORAL | 0 refills | Status: DC
Start: 2022-08-12 — End: 2022-10-25

## 2022-08-12 MED ORDER — ALLOPURINOL 100 MG PO TABS: 100.0000 mg | ORAL_TABLET | Freq: Every day | ORAL | 0 refills | Status: DC

## 2022-08-12 NOTE — Patient Instructions (Signed)
Monitoring your BP at home   Your BP was elevated today in office  Please keep a log of your BP at home.  The best time to take BP is 1st thing in the morning after waking.  Sit for 5 minutes with feet flat on the floor, arm at heart level.  Options for BP cuffs are at [employer], [employer], [employer], [facility] & [facility].  We will review measurements at follow up and determine plan for BP management.  If you have access, you can send a message in MyChart with your measurements prior to your follow up appointment.  The brand I recommend to get is Omron (Bronze).    

## 2022-08-12 NOTE — Progress Notes (Signed)
New Patient Office Visit  Subjective   Patient ID: GAVRIELA MERKERSON, female    DOB: Aug 18, 1960  Age: 62 y.o. MRN: [id number]  CC:  Chief Complaint  Patient presents with   New Patient (Initial Visit)    Establish care Med refills Labwork Sciatic nerve pain with numbness   HPI Kathalia Gilkes Schnarr presents to establish care  HTN Reports that her BP at home has been very hight. States that it is 185-200. She checks it daily. She is using an upper arm cuff.  Denies changes to vision, headaches.  States that she does have palpitations on occasion with anxiety and due to her heart murmur.  She is followed by Cardiology in [place]. Last seen in Feb.   Anxiety/Depression  Continues to take lexapro. States that it is working well for her. She used to take Lorazepam as needed for anxiety. Has not tried anything else in the past for anxiety episodes. States that she gets "worked up" over things.  States she was put on the aripiprazole when she went through menopause due to mood changes. Denies SI. Denies hallucinations.   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 Then she worked at [employer] She started having numbness in her legs. She presented to [facility] and received "a shot" that helped her. She continues to have pain in her knee and the left side of her leg.  Reports that she wears supportive shoes for work. Rates pain as 7/10.   Outpatient Encounter Medications as of 08/12/2022  Medication Sig   allopurinol (ZYLOPRIM) 100 MG tablet TAKE 1 TABLET EVERY DAY   ARIPiprazole (ABILIFY) 2 MG tablet Take 1 tablet by mouth daily.   atenolol (TENORMIN) 25 MG tablet Take 1 tablet by mouth daily.   benazepril (LOTENSIN) 40 MG tablet Take by mouth.   benazepril-hydrochlorthiazide (LOTENSIN HCT) 20-12.5 MG per tablet TAKE 2 TABLETS EVERY DAY MUST BE SEEN FOR MORE REFILLS (Patient taking differently: Take 2 tablets by mouth daily.)   cetirizine (ZYRTEC ALLERGY) 10  MG tablet Take 10 mg by mouth at bedtime. For allergies   chlorthalidone (HYGROTON) 25 MG tablet Take 12.5 mg by mouth daily.   cloNIDine (CATAPRES) 0.2 MG tablet Take 0.2 mg by mouth 2 (two) times daily.   diclofenac (VOLTAREN) 50 MG EC tablet TAKE 1 TABLET BY MOUTH TWICE A DAY WITH FOOD AS NEEDED FOR PAIN AND SWELLING   escitalopram (LEXAPRO) 20 MG tablet Take 20 mg by mouth daily.    fenofibrate (TRICOR) 145 MG tablet Take 1 tablet by mouth daily.   fluticasone (FLONASE) 50 MCG/ACT nasal spray 2 sprays every day until directed to stop   ipratropium (ATROVENT) 0.06 % nasal spray 2 sprays into each nostril Three (3) times a day.   LORazepam (ATIVAN) 1 MG tablet Take 1 mg by mouth 3 (three) times daily as needed for anxiety.    metFORMIN (GLUCOPHAGE) 500 MG tablet Take 1 tablet by mouth 2 (two) times daily.   metoprolol succinate (TOPROL-XL) 100 MG 24 hr tablet Take 100 mg by mouth daily. Take with or immediately following a meal.   NIFEdipine (PROCARDIA XL/NIFEDICAL-XL) 90 MG 24 hr tablet Take 1 tablet by mouth daily.   omeprazole (PRILOSEC) 10 MG capsule Take 1 capsule by mouth daily.   spironolactone (ALDACTONE) 25 MG tablet Take 25 mg by mouth daily.   aspirin EC 81 MG tablet Take by mouth.   cyclobenzaprine (FLEXERIL) 10 MG tablet  TAKE ONE TABLET TWICE DAILY AS NEEDED   ibuprofen (ADVIL) 800 MG tablet TAKE ONE TABLET BY MOUTH TWICE DAILY AFTER MEALS AS NEEDED FOR PAIN   OZEMPIC, 0.25 OR 0.5 MG/DOSE, 2 MG/3ML SOPN Inject 0.5mg  subcutaneously every week (Patient not taking: Reported on 08/12/2022)   [DISCONTINUED] amLODipine (NORVASC) 10 MG tablet TAKE 1 TABLET BY MOUTH DAILY   [DISCONTINUED] aspirin EC 325 MG EC tablet Take 1 tablet (325 mg total) by mouth daily with breakfast. 1 tablet a day for 30 days to prevent blood clots (Patient taking differently: Take 81 mg by mouth daily with breakfast. 1 tablet a day for 30 days to prevent blood clots)   [DISCONTINUED] BYSTOLIC 10 MG tablet TAKE 1/2  TABLET ONCE DAILY (Patient taking differently: Take 5 mg by mouth daily. )   [DISCONTINUED] Evolocumab (REPATHA SURECLICK) 140 MG/ML SOAJ Inject 140 mg into the skin every 14 (fourteen) days.   [DISCONTINUED] ezetimibe (ZETIA) 10 MG tablet Take 1 tablet (10 mg total) by mouth daily.   [DISCONTINUED] Pitavastatin Calcium (LIVALO) 2 MG TABS Take 1 tablet (2 mg total) by mouth daily.   [DISCONTINUED] Pitavastatin Calcium (LIVALO) 2 MG TABS Take 1 tablet (2 mg total) by mouth daily.   [DISCONTINUED] valACYclovir (VALTREX) 1000 MG tablet as needed.   No facility-administered encounter medications on file as of 08/12/2022.    Past Medical History:  Diagnosis Date   Anxiety    Complication of anesthesia    Depression    DJD (degenerative joint disease) of knee    Elevated liver enzymes    Gout    Hot flashes    Hyperlipidemia    Hypertension    Pneumonia    PONV (postoperative nausea and vomiting)    Very per pt.   Seasonal allergies    Vitamin D deficiency    Past Surgical History:  Procedure Laterality Date   CARPAL TUNNEL RELEASE     right   CESAREAN SECTION  1990   COLONOSCOPY     KNEE ARTHROSCOPY     right x 2   TOTAL KNEE ARTHROPLASTY Right 09/24/2018   Procedure: TOTAL KNEE ARTHROPLASTY;  Surgeon: Salvatore Marvel, MD;  Location: [facility];  Service: Orthopedics;  Laterality: Right;   Family History  Problem Relation Age of Onset   Hypertension Mother    Arthritis Mother    Hypertension Father    Arthritis Father    Colon cancer Neg Hx    Stomach cancer Neg Hx    BRCA 1/2 Neg Hx    Social History   Socioeconomic History   Marital status: Married    Spouse name: Not on file   Number of children: 1   Years of education: Not on file   Highest education level: Not on file  Occupational History   Not on file  Tobacco Use   Smoking status: Former    Packs/day: 1.00    Years: 18.00    Additional pack years: 0.00    Total pack years: 18.00    Types: Cigarettes     Quit date: 03/05/2004    Years since quitting: 18.4   Smokeless tobacco: Never  Vaping Use   Vaping Use: Never used  Substance and Sexual Activity   Alcohol use: No   Drug use: No   Sexual activity: Yes  Other Topics Concern   Not on file  Social History Narrative   Not on file   Social Determinants of Health   Financial Resource Strain: Not  on file  Food Insecurity: Not on file  Transportation Needs: Not on file  Physical Activity: Not on file  Stress: Not on file  Social Connections: Not on file  Intimate Partner Violence: Not on file   ROS As per HPI   Objective   BP (!) 90/48   Pulse (!) 48   Temp 98.7 F (37.1 C)   Ht 5\' 6"  (1.676 m)   Wt 169 lb (76.7 kg)   SpO2 94%   BMI 27.28 kg/m   Physical Exam Constitutional:      General: She is awake. She is not in acute distress.    Appearance: Normal appearance. She is well-developed, well-groomed and overweight.  Cardiovascular:     Rate and Rhythm: Regular rhythm. Bradycardia present.     Pulses:          Radial pulses are 2+ on the right side and 2+ on the left side.     Heart sounds: Murmur heard.     Systolic murmur is present with a grade of 3/6.  Pulmonary:     Effort: Pulmonary effort is normal.     Breath sounds: Normal breath sounds and air entry. No stridor, decreased air movement or transmitted upper airway sounds. No decreased breath sounds, wheezing, rhonchi or rales.  Musculoskeletal:        General: No swelling or deformity.     Right lower leg: No edema.     Left lower leg: No edema.  Skin:    General: Skin is warm.     Capillary Refill: Capillary refill takes less than 2 seconds.  Neurological:     General: No focal deficit present.     Mental Status: She is alert, oriented to person, place, and time and easily aroused. Mental status is at baseline.     GCS: GCS eye subscore is 4. GCS verbal subscore is 5. GCS motor subscore is 6.  Psychiatric:        Attention and Perception: Attention and  perception normal.        Mood and Affect: Mood and affect normal.        Speech: Speech normal.        Behavior: Behavior normal. Behavior is cooperative.        Thought Content: Thought content normal.        Cognition and Memory: Cognition and memory normal.        Judgment: Judgment normal.   Assessment & Plan:  1. Hypertension associated with diabetes [facility]) Patient reported that was currently taking metoprolol, atenolol, spironolactone, chlorthalidone, clonidine, and nifedipine. Will clarify with patient care team pharmacologic regimen. Will request hold parameters for patient.  Per chart review from Cardiology 05/10/22, patient was instructed to stop atenolol and continue metoprolol. Will instruct and reiterate this information to patient.  GFR from 05/19/22 was reduced at 66ml/min. Will repeat today and hold chlorthalidone at this time given patient symptoms and GFR.  Labs as below. Will communicate results to patient once available.  Not fasting. Starting a new job next week and is not able to come back in for labs.  - Bayer DCA Hb A1c Waived - CBC with Differential/Platelet - CMP14+EGFR - benazepril (LOTENSIN) 40 MG tablet; Take 1 tablet (40 mg total) by mouth daily.  Dispense: 60 tablet; Refill: 0 - metoprolol succinate (TOPROL-XL) 100 MG 24 hr tablet; Take 1 tablet (100 mg total) by mouth daily. Take with or immediately following a meal.  Dispense: 60 tablet; Refill: 0 -  NIFEdipine (PROCARDIA XL/NIFEDICAL-XL) 90 MG 24 hr tablet; Take 1 tablet (90 mg total) by mouth daily.  Dispense: 30 tablet; Refill: 1 - spironolactone (ALDACTONE) 25 MG tablet; Take 1 tablet (25 mg total) by mouth daily.  Dispense: 60 tablet; Refill: 0  Echocardiogram Complete WO Enhancing Agent 02/24/2021 Left ventricle size is normal. There is mild concentric hypertrophy. Systolic function is normal. EF: 60-65%. Wall motion is normal. Doppler parameters indicate normal diastolic function. Left ventricle size is  normal. There is mild concentric hypertrophy. Systolic function is normal. EF: 60-65%. Wall motion is normal. Doppler parameters indicate normal diastolic function.   2. Murmur, cardiac Patient has a murmur on exam today. She is established with Cardiology in [place]. She was recently seen in February of 2024. Echo as above. Will refer back to cardiology for assistance with management.   3. OSA on CPAP Controlled on CPAP.   4. Hyperlipidemia associated with type 2 diabetes mellitus (HCC) Labs as below. Will communicate results to patient once available.  Not fasting. Reports that she cannot come back in for fasting labs.  Refill as below. Intolerance to statins.  - fenofibrate (TRICOR) 145 MG tablet; Take 1 tablet (145 mg total) by mouth daily.  Dispense: 30 tablet; Refill: 1 - Lipid panel  5. Type 2 diabetes mellitus without complication, without long-term current use of insulin (HCC) Patient reported history of T2DM. Will request records to confirm.  A1C within goal <7.0%. Today it is 6.0%. Taking Metformin. Reports that she was previously on [facility], but is unsure why she was taken off of it.   Refill as below.  - metFORMIN (GLUCOPHAGE) 500 MG tablet; Take 1 tablet (500 mg total) by mouth 2 (two) times daily.  Dispense: 120 tablet; Refill: 0  6. Personal history of other endocrine, nutritional and metabolic disease Labs as below. Will communicate results to patient once available.  - TSH - Vitamin B12 - VITAMIN D 25 Hydroxy (Vit-D Deficiency, Fractures)  7. Moderate episode of recurrent major depressive disorder (HCC) 8. Anxiety - escitalopram (LEXAPRO) 20 MG tablet; Take 1 tablet (20 mg total) by mouth daily.  Dispense: 60 tablet; Refill: 0 Patient states that she is doing very well on Lexapro. She does not want to continue Abilify. Will discontinue at this time. Instructed patient to follow up closely if she notices abrupt change. PDMP reviewed. She has not taken Lorazepam in  several weeks. Denies withdrawal. Denies SI. Safety contract established.   9. Back pain with left-sided sciatica Imaging as below. Will start with conservative management. Patient declined referral to PT.  - predniSONE (DELTASONE) 20 MG tablet; Take 2 tablets (40 mg total) by mouth daily with breakfast for 5 days.  Dispense: 10 tablet; Refill: 0  XR Spine Lumbar 2-3 Views 06/04/21 FINDINGS:  - No acute fractures with preservation of vertebral body height.  - Prevertebral soft tissues are normal.   - Approximately 4 mm anterior subluxation of L4 on L5.  - Small plate osteophytes of the lower thoracic and lumbar spine with moderate degenerative facet changes L4-L5 and L5-S1.  - No destructive osseous processes.  - Additional Comments: Calcification of the abdominal aorta. Moderate retained stool of the colon.   10. Gastroesophageal reflux disease, unspecified whether esophagitis present Refill as below.  - omeprazole (PRILOSEC) 10 MG capsule; Take 1 capsule (10 mg total) by mouth daily.  Dispense: 30 capsule; Refill: 1  11. Seasonal allergies Refill as below. - cetirizine (ZYRTEC ALLERGY) 10 MG tablet; Take 1 tablet (10  mg total) by mouth at bedtime for 60 doses. For allergies  Dispense: 60 tablet; Refill: 0  The above assessment and management plan was discussed with the patient. The patient verbalized understanding of and has agreed to the management plan using shared-decision making. Patient is aware to call the clinic if they develop any new symptoms or if symptoms fail to improve or worsen. Patient is aware when to return to the clinic for a follow-up visit. Patient educated on when it is appropriate to go to the emergency department.   Return in about 4 weeks (around 09/09/2022).   Neale Burly, DNP-FNP [facility] [address] [place], [place] [address] [phone]

## 2022-08-13 LAB — CMP14+EGFR
ALT: 22 IU/L (ref 0–32)
AST: 22 IU/L (ref 0–40)
Albumin/Globulin Ratio: 1.7 (ref 1.2–2.2)
Albumin: 4.5 g/dL (ref 3.8–4.9)
Alkaline Phosphatase: 45 IU/L (ref 44–121)
BUN/Creatinine Ratio: 24 (ref 12–28)
BUN: 27 mg/dL (ref 8–27)
Bilirubin Total: 0.2 mg/dL (ref 0.0–1.2)
CO2: 24 mmol/L (ref 20–29)
Calcium: 10.6 mg/dL — ABNORMAL HIGH (ref 8.7–10.3)
Chloride: 102 mmol/L (ref 96–106)
Creatinine, Ser: 1.14 mg/dL — ABNORMAL HIGH (ref 0.57–1.00)
Globulin, Total: 2.6 g/dL (ref 1.5–4.5)
Glucose: 98 mg/dL (ref 70–99)
Potassium: 3.9 mmol/L (ref 3.5–5.2)
Sodium: 142 mmol/L (ref 134–144)
Total Protein: 7.1 g/dL (ref 6.0–8.5)
eGFR: 55 mL/min/{1.73_m2} — ABNORMAL LOW (ref >59)

## 2022-08-13 LAB — CBC WITH DIFFERENTIAL/PLATELET
Basophils Absolute: 0.1 10*3/uL (ref 0.0–0.2)
Basos: 1 %
EOS (ABSOLUTE): 0.3 10*3/uL (ref 0.0–0.4)
Eos: 3 %
Hematocrit: 38.6 % (ref 34.0–46.6)
Hemoglobin: 12.5 g/dL (ref 11.1–15.9)
Immature Grans (Abs): 0 10*3/uL (ref 0.0–0.1)
Immature Granulocytes: 0 %
Lymphocytes Absolute: 2.4 10*3/uL (ref 0.7–3.1)
Lymphs: 29 %
MCH: 26.8 pg (ref 26.6–33.0)
MCHC: 32.4 g/dL (ref 31.5–35.7)
MCV: 83 fL (ref 79–97)
Monocytes Absolute: 0.5 10*3/uL (ref 0.1–0.9)
Monocytes: 6 %
Neutrophils Absolute: 5.1 10*3/uL (ref 1.4–7.0)
Neutrophils: 61 %
Platelets: 348 10*3/uL (ref 150–450)
RBC: 4.67 x10E6/uL (ref 3.77–5.28)
RDW: 14.4 % (ref 11.7–15.4)
WBC: 8.4 10*3/uL (ref 3.4–10.8)

## 2022-08-13 LAB — LIPID PANEL
Chol/HDL Ratio: 4.6 ratio — ABNORMAL HIGH (ref 0.0–4.4)
Cholesterol, Total: 231 mg/dL — ABNORMAL HIGH (ref 100–199)
HDL: 50 mg/dL (ref >39)
LDL Chol Calc (NIH): 148 mg/dL — ABNORMAL HIGH (ref 0–99)
Triglycerides: 184 mg/dL — ABNORMAL HIGH (ref 0–149)
VLDL Cholesterol Cal: 33 mg/dL (ref 5–40)

## 2022-08-13 LAB — TSH: TSH: 1.32 u[IU]/mL (ref 0.450–4.500)

## 2022-08-13 LAB — VITAMIN B12: Vitamin B-12: 300 pg/mL (ref 232–1245)

## 2022-08-13 LAB — VITAMIN D 25 HYDROXY (VIT D DEFICIENCY, FRACTURES): Vit D, 25-Hydroxy: 27.7 ng/mL — ABNORMAL LOW (ref 30.0–100.0)

## 2022-08-16 NOTE — Addendum Note (Signed)
Addended by: Neale Burly on: 08/16/2022 12:39 PM   Modules accepted: Orders

## 2022-09-02 ENCOUNTER — Encounter: Payer: Self-pay | Admitting: *Deleted

## 2022-09-13 ENCOUNTER — Encounter: Payer: Self-pay | Admitting: Family Medicine

## 2022-09-13 ENCOUNTER — Ambulatory Visit (INDEPENDENT_AMBULATORY_CARE_PROVIDER_SITE_OTHER): Payer: 59 | Admitting: Family Medicine

## 2022-09-13 VITALS — BP 157/64 | HR 54 | Temp 98.5°F | Ht 66.0 in | Wt 166.0 lb

## 2022-09-13 DIAGNOSIS — E1169 Type 2 diabetes mellitus with other specified complication: Secondary | ICD-10-CM

## 2022-09-13 DIAGNOSIS — F330 Major depressive disorder, recurrent, mild: Secondary | ICD-10-CM | POA: Diagnosis not present

## 2022-09-13 DIAGNOSIS — K219 Gastro-esophageal reflux disease without esophagitis: Secondary | ICD-10-CM | POA: Diagnosis not present

## 2022-09-13 DIAGNOSIS — I152 Hypertension secondary to endocrine disorders: Secondary | ICD-10-CM | POA: Diagnosis not present

## 2022-09-13 DIAGNOSIS — E1159 Type 2 diabetes mellitus with other circulatory complications: Secondary | ICD-10-CM

## 2022-09-13 DIAGNOSIS — F419 Anxiety disorder, unspecified: Secondary | ICD-10-CM | POA: Diagnosis not present

## 2022-09-13 DIAGNOSIS — E785 Hyperlipidemia, unspecified: Secondary | ICD-10-CM | POA: Diagnosis not present

## 2022-09-13 LAB — PTH, INTACT AND CALCIUM

## 2022-09-13 NOTE — Progress Notes (Signed)
Acute Office Visit  Subjective:  Patient ID: [name], female    DOB: 1960/08/14, 62 y.o.   MRN: [id number]  Chief Complaint  Patient presents with   Medical Management of Chronic Issues    Hypertension    HPI Patient is in today for follow up of Hypertension. Patient reports that because of her insurance change, she is not able to follow up with prior Cardiologist at [facility].  Hypertension Patient reports Blood pressure at home: has not been monitoring at home  Current medications: metoprolol, nifedipine, benazepril  Side effects: none  She is trying to  decrease fried foods   in diet. She is exercising. She does not smoke. Endorses nausea  ROS: Denies headache, dizziness, visual changes, vomiting, chest pain, LE swelling, abdominal pain, PND, orthopnea, syncope or shortness of breath.  The 10-year ASCVD risk score (Arnett DK, et al., 2019) is: 5.5%  Depression  States that since stopping Abilify. Patient self-discontinued Abilify. In addition, she has not had Lorazepam she has not had "any get up and go." Reports that she has been feeling more down and depressed since last our last visit. Denies SI. Endorses lack of motivation. Has not ever gone to psychiatry or seen counseling. States that she was taking the Lorazepam at night. States that she is anxious and gets nervous. States that she does not have any trouble sleeping. Denies frequent awakenings.   GERD  States that it is doing well. Denies hematochezia, choking on food, trouble swallowing.   ROS As per HPI   Objective:  BP (!) 157/64   Pulse (!) 54   Temp 98.5 F (36.9 C)   Ht 5\' 6"  (1.676 m)   Wt 166 lb (75.3 kg)   SpO2 96%   BMI 26.79 kg/m   Physical Exam Constitutional:      General: She is awake. She is not in acute distress.    Appearance: Normal appearance. She is well-developed and well-groomed. She is not ill-appearing, toxic-appearing or diaphoretic.  Cardiovascular:     Rate and Rhythm:  Normal rate and regular rhythm.     Pulses: Normal pulses.          Radial pulses are 2+ on the right side and 2+ on the left side.       Posterior tibial pulses are 2+ on the right side and 2+ on the left side.     Heart sounds: Murmur heard.     No gallop.  Pulmonary:     Effort: Pulmonary effort is normal. No respiratory distress.     Breath sounds: Normal breath sounds. No stridor. No wheezing, rhonchi or rales.  Musculoskeletal:     Cervical back: Full passive range of motion without pain and neck supple.     Right lower leg: No edema.     Left lower leg: No edema.  Skin:    General: Skin is warm.     Capillary Refill: Capillary refill takes less than 2 seconds.  Neurological:     General: No focal deficit present.     Mental Status: She is alert, oriented to person, place, and time and easily aroused. Mental status is at baseline.     GCS: GCS eye subscore is 4. GCS verbal subscore is 5. GCS motor subscore is 6.     Motor: No weakness.  Psychiatric:        Attention and Perception: Attention and perception normal.        Mood and Affect: Mood  is anxious. Affect is tearful.        Speech: Speech normal.        Behavior: Behavior normal. Behavior is cooperative.        Thought Content: Thought content normal. Thought content does not include homicidal or suicidal ideation. Thought content does not include homicidal or suicidal plan.        Cognition and Memory: Cognition and memory normal.        Judgment: Judgment normal.       09/13/2022    8:04 AM 08/12/2022   11:31 AM 06/16/2017    3:10 PM  Depression screen PHQ 2/9  Decreased Interest 0 0 0  Down, Depressed, Hopeless 1 0 0  PHQ - 2 Score 1 0 0  Altered sleeping 0 0   Tired, decreased energy 1 0   Change in appetite 0 0   Feeling bad or failure about yourself  0 0   Trouble concentrating 0 0   Moving slowly or fidgety/restless 0 0   Suicidal thoughts 0 0   PHQ-9 Score 2 0   Difficult doing work/chores Somewhat  difficult Not difficult at all       09/13/2022    8:04 AM 08/12/2022   11:31 AM  GAD 7 : Generalized Anxiety Score  Nervous, Anxious, on Edge 1 0  Control/stop worrying 0 0  Worry too much - different things 0 0  Trouble relaxing 0 0  Restless 0 0  Easily annoyed or irritable 0 0  Afraid - awful might happen 0 0  Total GAD 7 Score 1 0  Anxiety Difficulty Not difficult at all Not difficult at all   Assessment & Plan:  1. Hypertension associated with diabetes [facility]) Patient remains uncontrolled. Disucssed with patient that it may be due to other causes. Will assess for additional causes as below. Patient is on max dose of triple therapy for HTN. She is not taking a thiazide diuretic. However, given her GFR on 08/12/22 would like to avoid diuretics at this time. Will refer back to cardiology as patient lost cardiologist with change in her insurance.  - Ambulatory referral to Cardiology - US Renal Artery Stenosis; Future - PTH, Intact and Calcium  2. Hyperlipidemia associated with type 2 diabetes mellitus (HCC) Reviewed ASCVD risk score. Intolerant to statins. Would like to consider zetia.  ASCVD (Atherosclerotic Cardiovascular Disease) 2013 Risk Calculator from AHA/ACC from StatOfficial.co.za  on 09/13/2022 ** All calculations should be rechecked by clinician prior to use **  RESULT SUMMARY:   Moderate- to high-intensity statin recommended because 10-year risk >7.5%  To view statin dosages by intensity, see Evidence section.  12.7%   Risk of cardiovascular event (coronary or stroke death or non-fatal MI or stroke) in next 10 years.  2.6%   10-year cardiovascular risk if risk factors were optimal.  INPUTS: Age --> 62 years Diabetes --> 0 = No Sex --> 0 = Female Smoker --> 1 = Yes Total cholesterol --> 231 mg/dL HDL cholesterol --> 50 mg/dL Systolic blood pressure --> 157 mm Hg Treatment for hypertension --> 0 = No Race --> 1 = White  3. Mild episode of recurrent major depressive  disorder (HCC) Recently discontinued Abilify. Remains on Lexapro. Referral placed as below for counseling. Patient denies SI. Safety contract established. Patient and/or legal guardian verbally consented to [facility] services about presenting concerns and psychiatric consultation as appropriate.  The services will be billed as appropriate for the patient.  - Ambulatory  referral to Integrated Behavioral Health  4. Anxiety Not at goal due to stressors with her job. Remains on Lexapro. Has never been evaluated by psychiatry. Patient scored well controlled, however on exam was tearful and anxious. Will continue to evaluate her response since coming off of Abilify and benzos. Referral placed as below for counseling. Patient denies SI. Safety contract established. Patient and/or legal guardian verbally consented to [facility] services about presenting concerns and psychiatric consultation as appropriate.  The services will be billed as appropriate for the patient.  - Ambulatory referral to Integrated Behavioral Health  5. Gastroesophageal reflux disease without esophagitis Well controlled. Continue current regimen.    The above assessment and management plan was discussed with the patient. The patient verbalized understanding of and has agreed to the management plan using shared-decision making. Patient is aware to call the clinic if they develop any new symptoms or if symptoms fail to improve or worsen. Patient is aware when to return to the clinic for a follow-up visit. Patient educated on when it is appropriate to go to the emergency department.   Return in about 6 weeks (around 10/25/2022) for Chronic Condition Follow up.  Neale Burly, DNP-FNP [facility] [address] [place], [place] [address] [phone]

## 2022-09-14 LAB — PTH, INTACT AND CALCIUM
Calcium: 10.3 mg/dL (ref 8.7–10.3)
PTH: 24 pg/mL (ref 15–65)

## 2022-09-14 NOTE — Progress Notes (Signed)
Calcium and PTH normal. Will await results of ultrasound and follow up with cardiology

## 2022-09-16 ENCOUNTER — Other Ambulatory Visit: Payer: Self-pay | Admitting: Family Medicine

## 2022-09-16 ENCOUNTER — Ambulatory Visit: Payer: 59 | Admitting: Family Medicine

## 2022-09-30 NOTE — Addendum Note (Signed)
Addended by: Neale Burly on: 09/30/2022 04:58 PM   Modules accepted: Orders

## 2022-10-04 ENCOUNTER — Telehealth: Payer: Self-pay | Admitting: Family Medicine

## 2022-10-04 NOTE — Telephone Encounter (Signed)
  Prescription Request  10/04/2022  Is this a "Controlled Substance" medicine? no  Have you seen your PCP in the last 2 weeks? No pt seen on 09/13/22 was referred to Cardiologist that can not see her until September pt needs a refill on this medication that was prescribed by previous cardiologist Dr. Madlyn Frankel, she can not go to him because her insurance does not cover him   If YES, route message to pool  -  If NO, patient needs to be scheduled for appointment.  What is the name of the medication or equipment? atenolol (TENORMIN)   Have you contacted your pharmacy to request a refill? yes   Which pharmacy would you like this sent to? [facility]   Patient notified that their request is being sent to the clinical staff for review and that they should receive a response within 2 business days.

## 2022-10-11 ENCOUNTER — Other Ambulatory Visit: Payer: Self-pay | Admitting: Family Medicine

## 2022-10-11 ENCOUNTER — Ambulatory Visit: Payer: Self-pay | Admitting: Family Medicine

## 2022-10-11 DIAGNOSIS — J302 Other seasonal allergic rhinitis: Secondary | ICD-10-CM

## 2022-10-11 DIAGNOSIS — E119 Type 2 diabetes mellitus without complications: Secondary | ICD-10-CM

## 2022-10-13 ENCOUNTER — Telehealth: Payer: Self-pay | Admitting: Family Medicine

## 2022-10-13 ENCOUNTER — Other Ambulatory Visit: Payer: Self-pay | Admitting: Family Medicine

## 2022-10-13 DIAGNOSIS — I152 Hypertension secondary to endocrine disorders: Secondary | ICD-10-CM

## 2022-10-13 DIAGNOSIS — E1159 Type 2 diabetes mellitus with other circulatory complications: Secondary | ICD-10-CM

## 2022-10-13 MED ORDER — SPIRONOLACTONE 25 MG PO TABS
25.0000 mg | ORAL_TABLET | Freq: Every day | ORAL | 0 refills | Status: DC
Start: 2022-10-13 — End: 2022-12-19

## 2022-10-13 MED ORDER — METOPROLOL SUCCINATE ER 100 MG PO TB24
100.0000 mg | ORAL_TABLET | Freq: Every day | ORAL | 0 refills | Status: DC
Start: 2022-10-13 — End: 2022-12-07

## 2022-10-13 MED ORDER — NIFEDIPINE ER OSMOTIC RELEASE 90 MG PO TB24
90.0000 mg | ORAL_TABLET | Freq: Every day | ORAL | 0 refills | Status: DC
Start: 2022-10-13 — End: 2022-12-07

## 2022-10-13 NOTE — Telephone Encounter (Signed)
LMOVM refills sent to pharmacy °

## 2022-10-13 NOTE — Telephone Encounter (Signed)
  Prescription Request  10/13/2022  Is this a "Controlled Substance" medicine?  Spironolactone 25 MG 0.5 tablet daily Chlorthalidone 25 MG 0.5 tablet daily metoprolol succinate (TOPROL-XL) 100 MG 24 hr tablet  NIFEdipine (PROCARDIA XL/NIFEDICAL-XL) 90 MG 24 hr tablet   Have you seen your PCP in the last 2 weeks? 10/25/2022  If YES, route message to pool  -  If NO, patient needs to be scheduled for appointment.  What is the name of the medication or equipment? Spironolactone 25 MG 0.5 tablet daily Chlorthalidone 25 MG 0.5 tablet daily metoprolol succinate (TOPROL-XL) 100 MG 24 hr tablet  NIFEdipine (PROCARDIA XL/NIFEDICAL-XL) 90 MG 24 hr tablet   Have you contacted your pharmacy to request a refill? NO [facility]   Which pharmacy would you like this sent to? [facility]  Pt says that her cardio dr no longer accepts her insurance, therefore, pt had to schedule with a new cardio dr. Pt needs refills on these medications that previous cardio dr prescribed.  Pt has an apt with new cardio dr Sept 4, 2024 in [place]   Patient notified that their request is being sent to the clinical staff for review and that they should receive a response within 2 business days.

## 2022-10-19 ENCOUNTER — Other Ambulatory Visit: Payer: Self-pay | Admitting: Family Medicine

## 2022-10-19 DIAGNOSIS — K219 Gastro-esophageal reflux disease without esophagitis: Secondary | ICD-10-CM

## 2022-10-25 ENCOUNTER — Ambulatory Visit (INDEPENDENT_AMBULATORY_CARE_PROVIDER_SITE_OTHER): Payer: 59 | Admitting: Family Medicine

## 2022-10-25 ENCOUNTER — Encounter: Payer: Self-pay | Admitting: Family Medicine

## 2022-10-25 VITALS — BP 157/70 | HR 50 | Temp 98.3°F | Ht 66.0 in | Wt 159.0 lb

## 2022-10-25 DIAGNOSIS — R001 Bradycardia, unspecified: Secondary | ICD-10-CM

## 2022-10-25 DIAGNOSIS — R110 Nausea: Secondary | ICD-10-CM | POA: Diagnosis not present

## 2022-10-25 DIAGNOSIS — E559 Vitamin D deficiency, unspecified: Secondary | ICD-10-CM

## 2022-10-25 DIAGNOSIS — E870 Hyperosmolality and hypernatremia: Secondary | ICD-10-CM

## 2022-10-25 DIAGNOSIS — I10 Essential (primary) hypertension: Secondary | ICD-10-CM | POA: Diagnosis not present

## 2022-10-25 DIAGNOSIS — K219 Gastro-esophageal reflux disease without esophagitis: Secondary | ICD-10-CM

## 2022-10-25 DIAGNOSIS — E1169 Type 2 diabetes mellitus with other specified complication: Secondary | ICD-10-CM | POA: Diagnosis not present

## 2022-10-25 DIAGNOSIS — E1159 Type 2 diabetes mellitus with other circulatory complications: Secondary | ICD-10-CM

## 2022-10-25 DIAGNOSIS — I152 Hypertension secondary to endocrine disorders: Secondary | ICD-10-CM

## 2022-10-25 DIAGNOSIS — F419 Anxiety disorder, unspecified: Secondary | ICD-10-CM

## 2022-10-25 DIAGNOSIS — E785 Hyperlipidemia, unspecified: Secondary | ICD-10-CM | POA: Diagnosis not present

## 2022-10-25 DIAGNOSIS — R944 Abnormal results of kidney function studies: Secondary | ICD-10-CM | POA: Diagnosis not present

## 2022-10-25 LAB — CBC WITH DIFFERENTIAL/PLATELET
Basophils Absolute: 0.1 10*3/uL (ref 0.0–0.2)
Basos: 1 %
EOS (ABSOLUTE): 0.2 10*3/uL (ref 0.0–0.4)
Eos: 2 %
Hematocrit: 40.1 % (ref 34.0–46.6)
Hemoglobin: 13 g/dL (ref 11.1–15.9)
Lymphocytes Absolute: 1.7 10*3/uL (ref 0.7–3.1)
MCHC: 32.4 g/dL (ref 31.5–35.7)
MCV: 84 fL (ref 79–97)
Monocytes Absolute: 0.4 10*3/uL (ref 0.1–0.9)
Platelets: 348 10*3/uL (ref 150–450)
WBC: 9.6 10*3/uL (ref 3.4–10.8)

## 2022-10-25 LAB — CMP14+EGFR

## 2022-10-25 MED ORDER — BENAZEPRIL HCL 40 MG PO TABS
40.0000 mg | ORAL_TABLET | Freq: Every day | ORAL | 0 refills | Status: DC
Start: 2022-10-25 — End: 2022-12-07

## 2022-10-25 MED ORDER — ONDANSETRON HCL 4 MG PO TABS: 4.0000 mg | ORAL_TABLET | Freq: Three times a day (TID) | ORAL | 0 refills | Status: DC | PRN

## 2022-10-25 NOTE — Progress Notes (Signed)
No retinopathy. Due for next exam 04/26/22

## 2022-10-25 NOTE — Progress Notes (Signed)
Acute Office Visit  Subjective:  Patient ID: [name], female    DOB: [birth year], 61 y.o.   MRN: [id number]  Chief Complaint  Patient presents with   Medical Management of Chronic Issues    HTN, HLD,depression, anxiety, GERD   HPI Hypertension  Patient is in today for follow up of chronic conditions  Blood pressure monitor - has been taking it daily  BP at home average 135/60, 145/70  ROS Denies anxiety, fatigue, peripheral edema, changes to vision, chest pain, headaches, palpitations, sweats, SOB, PND, orthopnea, neck pain Meds ACEI, metoprolol, nifedipine, chlorthalidone  CAD risks: HTN, HLD, post menopausal  Had recent eye exam, requested records   Anxiety/Depression  States that it is a lot better. States that she has some symptoms of anxiousness and feeling "edgy". States that her new job is getting better. Is not taking abilify. Denies SI. Does not feel that she needs to start counseling at this time.   Nausea  States that it happens almost every morning. States that she is still taking omeprazole  Denies changes to diet. States that it will last about an hour. Worsened by the sight of food makes her nauseous. Once she eats she feels better. States that it could be related to nervousness for work. Started a couple of months ago. Denies vomiting   Vitamin D  States that she has not been taking vitamin D supplement.   Diabetes  States that she was never told she had Diabetes. Does not check her BG regularly. States that her A1C was "high" but she believed it pre-diabetes. She was previously on mounjaro, but her insurance did not cover it, so she was switched to ozempic. She has not taken ozempic in a few months.   HLD  States that she is taking Tricor and is doing well.   ROS As per HPI  Objective:  BP (!) 157/70   Pulse (!) 50   Temp 98.3 F (36.8 C)   Ht 5\' 6"  (1.676 m)   Wt 159 lb (72.1 kg)   SpO2 95%   BMI 25.66 kg/m   Physical  Exam Constitutional:      General: She is awake. She is not in acute distress.    Appearance: Normal appearance. She is well-developed and well-groomed. She is not ill-appearing, toxic-appearing or diaphoretic.  Cardiovascular:     Rate and Rhythm: Bradycardia present.     Pulses: Normal pulses.          Radial pulses are 2+ on the right side and 2+ on the left side.       Posterior tibial pulses are 2+ on the right side and 2+ on the left side.     Heart sounds: Murmur heard.     No gallop.  Pulmonary:     Effort: Pulmonary effort is normal. No respiratory distress.     Breath sounds: Normal breath sounds. No stridor. No wheezing, rhonchi or rales.  Musculoskeletal:     Cervical back: Full passive range of motion without pain and neck supple.     Right lower leg: No edema.     Left lower leg: No edema.  Skin:    General: Skin is warm.     Capillary Refill: Capillary refill takes less than 2 seconds.  Neurological:     General: No focal deficit present.     Mental Status: She is alert, oriented to person, place, and time and easily aroused. Mental status is at baseline.  GCS: GCS eye subscore is 4. GCS verbal subscore is 5. GCS motor subscore is 6.     Motor: No weakness.  Psychiatric:        Attention and Perception: Attention and perception normal.        Mood and Affect: Mood and affect normal.        Speech: Speech normal.        Behavior: Behavior normal. Behavior is cooperative.        Thought Content: Thought content normal. Thought content does not include homicidal or suicidal ideation. Thought content does not include homicidal or suicidal plan.        Cognition and Memory: Cognition and memory normal.        Judgment: Judgment normal.       10/25/2022    8:37 AM 09/13/2022    8:04 AM 08/12/2022   11:31 AM  Depression screen PHQ 2/9  Decreased Interest 0 0 0  Down, Depressed, Hopeless 0 1 0  PHQ - 2 Score 0 1 0  Altered sleeping 0 0 0  Tired, decreased energy 0  1 0  Change in appetite 0 0 0  Feeling bad or failure about yourself  0 0 0  Trouble concentrating 0 0 0  Moving slowly or fidgety/restless 0 0 0  Suicidal thoughts 0 0 0  PHQ-9 Score 0 2 0  Difficult doing work/chores Not difficult at all Somewhat difficult Not difficult at all      10/25/2022    8:38 AM 09/13/2022    8:04 AM 08/12/2022   11:31 AM  GAD 7 : Generalized Anxiety Score  Nervous, Anxious, on Edge 1 1 0  Control/stop worrying 0 0 0  Worry too much - different things 0 0 0  Trouble relaxing 0 0 0  Restless 0 0 0  Easily annoyed or irritable 0 0 0  Afraid - awful might happen 0 0 0  Total GAD 7 Score 1 1 0  Anxiety Difficulty Not difficult at all Not difficult at all Not difficult at all   Assessment & Plan:  1. Hypertension associated with diabetes (HCC) Refill as below. Patient not at goal today. Patient to monitor BP at home and follow up with PCP and Cardiology  - benazepril (LOTENSIN) 40 MG tablet; Take 1 tablet (40 mg total) by mouth daily.  Dispense: 60 tablet; Refill: 0 - CBC with Differential/Platelet - CMP14+EGFR  2. Hyperlipidemia associated with type 2 diabetes mellitus (HCC) Continue tricor. Labs due in one month. Patient intolerant to statins. Based on previous risk score, would like patient to follow up with Cardiology and consider Repatha.   The 10-year ASCVD risk score (Arnett DK, et al., 2019) is: 16%   Values used to calculate the score:     Age: 61 years     Sex: Female     Is Non-Hispanic African American: No     Diabetic: Yes     Tobacco smoker: No     Systolic Blood Pressure: 157 mmHg     Is BP treated: Yes     HDL Cholesterol: 50 mg/dL     Total Cholesterol: 231 mg/dL  3. Gastroesophageal reflux disease without esophagitis Well controlled. Continue current regimen.   4. Anxiety Well controlled. Continue current regimen. Declined [facility] referral. Denies SI. Safety contract established.   5. Decreased GFR Referral placed as  below for evaluation.  - Ambulatory referral to Nephrology  6. Nausea Medication as below. Patient to follow  up if not improved.  - ondansetron (ZOFRAN) 4 MG tablet; Take 1 tablet (4 mg total) by mouth every 8 (eight) hours as needed for nausea or vomiting.  Dispense: 20 tablet; Refill: 0  7. Vitamin D deficiency Lab previously placed. Continue supplementation. Will repeat in 1-2 months.   8. Bradycardia Reviewed EKG form 08/17/2018. Sinus bradycardia with HR 56 bpm. Patient asymptomatic.   The above assessment and management plan was discussed with the patient. The patient verbalized understanding of and has agreed to the management plan using shared-decision making. Patient is aware to call the clinic if they develop any new symptoms or if symptoms fail to improve or worsen. Patient is aware when to return to the clinic for a follow-up visit. Patient educated on when it is appropriate to go to the emergency department.   Return in about 6 weeks (around 12/06/2022) for Chronic Condition Follow up.  Neale Burly, DNP-FNP [facility] [address] [place], [place] [address] [phone]

## 2022-10-26 LAB — CBC WITH DIFFERENTIAL/PLATELET
Immature Grans (Abs): 0 10*3/uL (ref 0.0–0.1)
Immature Granulocytes: 0 %
Lymphs: 17 %
MCH: 27.4 pg (ref 26.6–33.0)
Monocytes: 4 %
Neutrophils Absolute: 7.3 10*3/uL — ABNORMAL HIGH (ref 1.4–7.0)
Neutrophils: 76 %
RBC: 4.75 x10E6/uL (ref 3.77–5.28)
RDW: 14.5 % (ref 11.7–15.4)

## 2022-10-26 LAB — CMP14+EGFR
ALT: 18 IU/L (ref 0–32)
Albumin: 4.6 g/dL (ref 3.9–4.9)
Alkaline Phosphatase: 42 IU/L — ABNORMAL LOW (ref 44–121)
BUN: 21 mg/dL (ref 8–27)
CO2: 25 mmol/L (ref 20–29)
Creatinine, Ser: 0.86 mg/dL (ref 0.57–1.00)
Globulin, Total: 2.6 g/dL (ref 1.5–4.5)
Glucose: 109 mg/dL — ABNORMAL HIGH (ref 70–99)
Sodium: 148 mmol/L — ABNORMAL HIGH (ref 134–144)
eGFR: 77 mL/min/{1.73_m2} (ref >59)

## 2022-10-26 NOTE — Progress Notes (Signed)
Elevated sodium. Would like patient to come back for repeat later this week. If any symptoms of excessive thirst, confusion, fatigue, nausea, or vomiting, recommend patient present to ED.

## 2022-10-26 NOTE — Addendum Note (Signed)
Addended by: Neale Burly on: 10/26/2022 06:01 PM   Modules accepted: Orders

## 2022-11-17 ENCOUNTER — Other Ambulatory Visit: Payer: 59

## 2022-11-30 DIAGNOSIS — I5032 Chronic diastolic (congestive) heart failure: Secondary | ICD-10-CM | POA: Diagnosis not present

## 2022-11-30 DIAGNOSIS — N179 Acute kidney failure, unspecified: Secondary | ICD-10-CM | POA: Diagnosis not present

## 2022-11-30 DIAGNOSIS — E870 Hyperosmolality and hypernatremia: Secondary | ICD-10-CM | POA: Diagnosis not present

## 2022-12-07 ENCOUNTER — Ambulatory Visit: Payer: 59 | Attending: Internal Medicine | Admitting: Internal Medicine

## 2022-12-07 ENCOUNTER — Encounter: Payer: Self-pay | Admitting: Internal Medicine

## 2022-12-07 VITALS — BP 136/72 | HR 50 | Ht 66.0 in | Wt 160.6 lb

## 2022-12-07 DIAGNOSIS — Z789 Other specified health status: Secondary | ICD-10-CM | POA: Diagnosis not present

## 2022-12-07 DIAGNOSIS — E7849 Other hyperlipidemia: Secondary | ICD-10-CM | POA: Diagnosis not present

## 2022-12-07 DIAGNOSIS — E785 Hyperlipidemia, unspecified: Secondary | ICD-10-CM

## 2022-12-07 DIAGNOSIS — E1159 Type 2 diabetes mellitus with other circulatory complications: Secondary | ICD-10-CM | POA: Diagnosis not present

## 2022-12-07 DIAGNOSIS — I152 Hypertension secondary to endocrine disorders: Secondary | ICD-10-CM | POA: Diagnosis not present

## 2022-12-07 DIAGNOSIS — Z7984 Long term (current) use of oral hypoglycemic drugs: Secondary | ICD-10-CM | POA: Diagnosis not present

## 2022-12-07 DIAGNOSIS — E1169 Type 2 diabetes mellitus with other specified complication: Secondary | ICD-10-CM | POA: Diagnosis not present

## 2022-12-07 MED ORDER — AMLODIPINE-VALSARTAN-HCTZ 5-160-12.5 MG PO TABS: 1.0000 | ORAL_TABLET | Freq: Every day | ORAL | 11 refills | Status: DC

## 2022-12-07 MED ORDER — METOPROLOL SUCCINATE ER 25 MG PO TB24
ORAL_TABLET | ORAL | 0 refills | Status: DC
Start: 2022-12-07 — End: 2023-03-14

## 2022-12-07 MED ORDER — CARVEDILOL 6.25 MG PO TABS: 6.2500 mg | ORAL_TABLET | Freq: Two times a day (BID) | ORAL | 3 refills | Status: DC

## 2022-12-07 NOTE — Progress Notes (Signed)
Cardiology Office Note  Date: 12/07/2022   ID: [name], DOB 1960/08/09, MRN [id number]  PCP:  Arrie Senate, FNP  Cardiologist:  None Electrophysiologist:  None   History of Present Illness: [name] is a 62 y.o. female known to have hyperlipidemia, hypertriglyceridemia, HTN was referred to cardiology clinic for management of HTN.  Home blood pressures range between 160 and 180 mmHg.  She was statin intolerant, tried simvastatin, pravastatin and atorvastatin with myalgias.  Did not try rosuvastatin.  She was previously on Repatha but insurance stopped paying for it.  Denies having any symptoms of angina, DOE, orthopnea, PND, leg swelling.  No palpitations, dizziness, presyncope and syncope.  Past Medical History:  Diagnosis Date   Anxiety    Complication of anesthesia    Depression    DJD (degenerative joint disease) of knee    Elevated liver enzymes    Gout    Hot flashes    Hyperlipidemia    Hypertension    Pneumonia    PONV (postoperative nausea and vomiting)    Very per pt.   Seasonal allergies    Vitamin D deficiency     Past Surgical History:  Procedure Laterality Date   CARPAL TUNNEL RELEASE     right   CESAREAN SECTION  1990   COLONOSCOPY     KNEE ARTHROSCOPY     right x 2   TOTAL KNEE ARTHROPLASTY Right 09/24/2018   Procedure: TOTAL KNEE ARTHROPLASTY;  Surgeon: Salvatore Marvel, MD;  Location: [facility];  Service: Orthopedics;  Laterality: Right;    Current Outpatient Medications  Medication Sig Dispense Refill   allopurinol (ZYLOPRIM) 100 MG tablet TAKE 1 TABLET DAILY 60 tablet 0   aspirin EC 81 MG tablet Take 1 tablet (81 mg total) by mouth daily. 60 tablet 0   benazepril (LOTENSIN) 40 MG tablet Take 1 tablet (40 mg total) by mouth daily. 60 tablet 0   cetirizine (ZYRTEC) 10 MG tablet TAKE 1 TABLET AT BEDTIME FOR ALLERGIES 60 tablet 5   cyclobenzaprine (FLEXERIL) 10 MG tablet TAKE ONE TABLET TWICE DAILY AS NEEDED      escitalopram (LEXAPRO) 20 MG tablet Take 1 tablet (20 mg total) by mouth daily. 60 tablet 0   fenofibrate (TRICOR) 145 MG tablet Take 1 tablet (145 mg total) by mouth daily. 30 tablet 1   fluticasone (FLONASE) 50 MCG/ACT nasal spray 2 sprays every day until directed to stop     furosemide (LASIX) 20 MG tablet Take 10 mg by mouth 2 (two) times daily.     ibuprofen (ADVIL) 800 MG tablet TAKE ONE TABLET BY MOUTH TWICE DAILY AFTER MEALS AS NEEDED FOR PAIN     ipratropium (ATROVENT) 0.06 % nasal spray 2 sprays into each nostril Three (3) times a day.     metFORMIN (GLUCOPHAGE) 500 MG tablet TAKE 1 TABLET 2 TIMES A DAY 120 tablet 0   metoprolol succinate (TOPROL-XL) 100 MG 24 hr tablet Take 1 tablet (100 mg total) by mouth daily. Take with or immediately following a meal. 60 tablet 0   NIFEdipine (PROCARDIA XL/NIFEDICAL XL) 60 MG 24 hr tablet Take 60 mg by mouth 2 (two) times daily.     omeprazole (PRILOSEC) 10 MG capsule TAKE 1 CAPSULE DAILY 30 capsule 4   ondansetron (ZOFRAN) 4 MG tablet Take 1 tablet (4 mg total) by mouth every 8 (eight) hours as needed for nausea or vomiting. 20 tablet 0   spironolactone (ALDACTONE) 25 MG tablet Take  1 tablet (25 mg total) by mouth daily. 60 tablet 0   No current facility-administered medications for this visit.   Allergies:  Demerol  [meperidine hcl], Sulfa antibiotics, Wound dressing adhesive, Demerol [meperidine], and Statins   Social History: The patient  reports that she quit smoking about 18 years ago. Her smoking use included cigarettes. She started smoking about 36 years ago. She has a 18 pack-year smoking history. She has never used smokeless tobacco. She reports that she does not drink alcohol and does not use drugs.   Family History: The patient's family history includes Arthritis in her father and mother; Hypertension in her father and mother.   ROS:  Please see the history of present illness. Otherwise, complete review of systems is positive for none.   All other systems are reviewed and negative.   Physical Exam: VS:  BP 136/72 (BP Location: Right Arm, Patient Position: Sitting, Cuff Size: Normal)   Pulse (!) 50   Ht 5\' 6"  (1.676 m)   Wt 160 lb 9.6 oz (72.8 kg)   SpO2 98%   BMI 25.92 kg/m , BMI Body mass index is 25.92 kg/m.  Wt Readings from Last 3 Encounters:  12/07/22 160 lb 9.6 oz (72.8 kg)  10/25/22 159 lb (72.1 kg)  09/13/22 166 lb (75.3 kg)    General: Patient appears comfortable at rest. HEENT: Conjunctiva and lids normal, oropharynx clear with moist mucosa. Neck: Supple, no elevated JVP or carotid bruits, no thyromegaly. Lungs: Clear to auscultation, nonlabored breathing at rest. Cardiac: Regular rate and rhythm, no S3 or significant systolic murmur, no pericardial rub. Abdomen: Soft, nontender, no hepatomegaly, bowel sounds present, no guarding or rebound. Extremities: No pitting edema, distal pulses 2+. Skin: Warm and dry. Musculoskeletal: No kyphosis. Neuropsychiatric: Alert and oriented x3, affect grossly appropriate.  Recent Labwork: 08/12/2022: TSH 1.320 10/25/2022: ALT 18; AST 22; BUN 21; Creatinine, Ser 0.86; Hemoglobin 13.0; Platelets 348; Potassium 4.1; Sodium 148     Component Value Date/Time   CHOL 231 (H) 08/12/2022 1222   TRIG 184 (H) 08/12/2022 1222   HDL 50 08/12/2022 1222   CHOLHDL 4.6 (H) 08/12/2022 1222   CHOLHDL 4.2 09/26/2012 1206   VLDL 46 (H) 09/26/2012 1206   LDLCALC 148 (H) 08/12/2022 1222     Assessment and Plan:  HTN, poorly controlled: Home blood pressures range around 160 and 180 mmHg SBP.  Discontinue benazepril, chlorthalidone, Lasix, nifedipine.  Start amlodipine-valsartan-HCTZ 5-1 60-12.5 mg once daily, wean off metoprolol succinate in 3 weeks and then start carvedilol 6.25 mg twice daily.  Keep a log of blood pressures, check blood pressure in a.m. and p.m.  Call clinic if BP more than 150 mmHg SBP.  HLD, hypertriglyceridemia: Statin intolerant, tried and failed 3 different  statins namely atorvastatin, simvastatin and pravastatin with resultant myalgias.  She was on Repatha in the past when insurance stopped paying for it.  Will place Pharm.D. referral to initiate Repatha.  OSA on CPAP: Congratulated, continue CPAP.     Medication Adjustments/Labs and Tests Ordered: Current medicines are reviewed at length with the patient today.  Concerns regarding medicines are outlined above.    Disposition:  Follow up 3 months  Signed, Artist Bloom Verne Spurr, MD, 12/07/2022 2:20 PM    [facility] at [facility] [address], [place], [place] [address]

## 2022-12-07 NOTE — Patient Instructions (Signed)
Medication Instructions:  Your physician has recommended you make the following change in your medication:   -Stop Lasix -Stop Benazepril -Stop Nifedipine  -Start Amlodipine-Valsartan-HCTZ 5-160-25 mg tablet once daily.  - Wean off of metoprolol succinate 100 mg tablet:  *Take 75 mg for 1 week  *Take 50 mg for 1 week  *Take 25 mg for 1 week  -Start coreg 6.25 mg twice daily after stopping Metoprolol  *If you need a refill on your cardiac medications before your next appointment, please call your pharmacy*   Lab Work: None If you have labs (blood work) drawn today and your tests are completely normal, you will receive your results only by: MyChart Message (if you have MyChart) OR A paper copy in the mail If you have any lab test that is abnormal or we need to change your treatment, we will call you to review the results.   Testing/Procedures: None   Follow-Up: At [facility], you and your health needs are our priority.  As part of our continuing mission to provide you with exceptional heart care, we have created designated Provider Care Teams.  These Care Teams include your primary Cardiologist (physician) and Advanced Practice Providers (APPs -  Physician Assistants and Nurse Practitioners) who all work together to provide you with the care you need, when you need it.  We recommend signing up for the patient portal called "MyChart".  Sign up information is provided on this After Visit Summary.  MyChart is used to connect with patients for Virtual Visits (Telemedicine).  Patients are able to view lab/test results, encounter notes, upcoming appointments, etc.  Non-urgent messages can be sent to your provider as well.   To learn more about what you can do with MyChart, go to [email or web address].    Your next appointment:   3 month(s)  Provider:   You may see Luane School, MD or one of the following Advanced Practice Providers on your designated Care Team:    [place], PA-C  Jacolyn Reedy, [place]     Other Instructions

## 2022-12-08 ENCOUNTER — Encounter: Payer: Self-pay | Admitting: Family Medicine

## 2022-12-08 ENCOUNTER — Other Ambulatory Visit (HOSPITAL_COMMUNITY): Payer: Self-pay

## 2022-12-08 ENCOUNTER — Telehealth: Payer: Self-pay | Admitting: Pharmacy Technician

## 2022-12-08 ENCOUNTER — Ambulatory Visit (INDEPENDENT_AMBULATORY_CARE_PROVIDER_SITE_OTHER): Payer: 59 | Admitting: Family Medicine

## 2022-12-08 ENCOUNTER — Other Ambulatory Visit: Payer: Self-pay | Admitting: Family Medicine

## 2022-12-08 VITALS — BP 159/57 | Temp 98.2°F | Ht 66.0 in | Wt 158.0 lb

## 2022-12-08 DIAGNOSIS — E119 Type 2 diabetes mellitus without complications: Secondary | ICD-10-CM

## 2022-12-08 DIAGNOSIS — K219 Gastro-esophageal reflux disease without esophagitis: Secondary | ICD-10-CM

## 2022-12-08 DIAGNOSIS — Z7984 Long term (current) use of oral hypoglycemic drugs: Secondary | ICD-10-CM | POA: Diagnosis not present

## 2022-12-08 DIAGNOSIS — E870 Hyperosmolality and hypernatremia: Secondary | ICD-10-CM

## 2022-12-08 DIAGNOSIS — G4733 Obstructive sleep apnea (adult) (pediatric): Secondary | ICD-10-CM

## 2022-12-08 DIAGNOSIS — I152 Hypertension secondary to endocrine disorders: Secondary | ICD-10-CM

## 2022-12-08 DIAGNOSIS — E1159 Type 2 diabetes mellitus with other circulatory complications: Secondary | ICD-10-CM | POA: Diagnosis not present

## 2022-12-08 LAB — CMP14+EGFR
ALT: 15 IU/L (ref 0–32)
AST: 14 IU/L (ref 0–40)
Albumin: 4.5 g/dL (ref 3.9–4.9)
Alkaline Phosphatase: 37 IU/L — ABNORMAL LOW (ref 44–121)
BUN/Creatinine Ratio: 24 (ref 12–28)
BUN: 22 mg/dL (ref 8–27)
Bilirubin Total: 0.3 mg/dL (ref 0.0–1.2)
CO2: 26 mmol/L (ref 20–29)
Calcium: 10.5 mg/dL — ABNORMAL HIGH (ref 8.7–10.3)
Chloride: 102 mmol/L (ref 96–106)
Creatinine, Ser: 0.9 mg/dL (ref 0.57–1.00)
Globulin, Total: 2.3 g/dL (ref 1.5–4.5)
Glucose: 100 mg/dL — ABNORMAL HIGH (ref 70–99)
Potassium: 3.7 mmol/L (ref 3.5–5.2)
Sodium: 143 mmol/L (ref 134–144)
Total Protein: 6.8 g/dL (ref 6.0–8.5)
eGFR: 73 mL/min/{1.73_m2} (ref >59)

## 2022-12-08 LAB — MICROSCOPIC EXAMINATION
RBC, Urine: NONE SEEN /HPF (ref 0–2)
Renal Epithel, UA: NONE SEEN /HPF
Yeast, UA: NONE SEEN

## 2022-12-08 LAB — BAYER DCA HB A1C WAIVED: HB A1C (BAYER DCA - WAIVED): 5.6 % (ref 4.8–5.6)

## 2022-12-08 LAB — URINALYSIS, ROUTINE W REFLEX MICROSCOPIC
Bilirubin, UA: NEGATIVE
Glucose, UA: NEGATIVE
Leukocytes,UA: NEGATIVE
Nitrite, UA: NEGATIVE
RBC, UA: NEGATIVE
Specific Gravity, UA: 1.02 (ref 1.005–1.030)
Urobilinogen, Ur: 0.2 mg/dL (ref 0.2–1.0)
pH, UA: 5.5 (ref 5.0–7.5)

## 2022-12-08 NOTE — Progress Notes (Signed)
Subjective:  Patient ID: [name], female    DOB: 11-01-1960, 62 y.o.   MRN: [id number]  Patient Care Team: Arrie Senate, FNP as PCP - General (Family Medicine) Delora Fuel, OD (Optometry)   Chief Complaint:  Medical Management of Chronic Issues  HPI: [name] is a 62 y.o. female presenting on 12/08/2022 for Medical Management of Chronic Issues  HPI 1. Hypernatremia Denies any symptoms.   2. Hypertension associated with diabetes (HCC) Went to cardiology and nephrology for evalation  States that both have different plans for her.  States that she is taking her BP at home. She is to let cardiology know if it is over 150 systolic   3. Gastroesophageal reflux disease without esophagitis States this is going well.  Continues on prilosec  Denies trouble swallowing, hematemeis, hematchezia   4. OSA on CPAP Sleeps with CPAP  States that she does not have 100% compliance.  Reports that she will use it a few times per week.  Denies daytime sleepiness and orthopnea   5. Type 2 diabetes mellitus without complication, without long-term current use of insulin (HCC) Does not have BG monitor at hom.  Taking Metformin daily  Last eye exam: UTD  Last foot exam: due  Last A1c:  Lab Results  Component Value Date   HGBA1C 6.0 (H) 08/12/2022   Nephropathy screen indicated?: due  Last flu, zoster and/or pneumovax:  Immunization History  Administered Date(s) Administered   Moderna Sars-Covid-2 Vaccination 02/12/2020, 03/18/2020    ROS: Denies dizziness, LOC, polyuria, polydipsia, unintended weight loss/gain, foot ulcerations, numbness or tingling in extremities, shortness of breath or chest pain.  6. Anxiety/Depression  States that she is leaving stressful job. 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  States that her stress is still high now, but imporving because she knows that she is leaving.  She is taking lexapro and  is doing well on it.  No longer taking Abilify and Lorazepam.   Relevant past medical, surgical, family, and social history reviewed and updated as indicated.  Allergies and medications reviewed and updated. Data reviewed: Chart in Epic.   Past Medical History:  Diagnosis Date   Anxiety    Complication of anesthesia    Depression    DJD (degenerative joint disease) of knee    Elevated liver enzymes    Gout    Hot flashes    Hyperlipidemia    Hypertension    Pneumonia    PONV (postoperative nausea and vomiting)    Very per pt.   Seasonal allergies    Vitamin D deficiency     Past Surgical History:  Procedure Laterality Date   CARPAL TUNNEL RELEASE     right   CESAREAN SECTION  1990   COLONOSCOPY     KNEE ARTHROSCOPY     right x 2   TOTAL KNEE ARTHROPLASTY Right 09/24/2018   Procedure: TOTAL KNEE ARTHROPLASTY;  Surgeon: Salvatore Marvel, MD;  Location: [facility];  Service: Orthopedics;  Laterality: Right;   Social History   Socioeconomic History   Marital status: Married    Spouse name: Not on file   Number of children: 1   Years of education: Not on file   Highest education level: Not on file  Occupational History   Not on file  Tobacco Use   Smoking status: Former    Current packs/day: 0.00    Average packs/day: 1 pack/day for 18.0 years (18.0  ttl pk-yrs)    Types: Cigarettes    Start date: 03/05/1986    Quit date: 03/05/2004    Years since quitting: 18.7   Smokeless tobacco: Never  Vaping Use   Vaping status: Never Used  Substance and Sexual Activity   Alcohol use: No   Drug use: No   Sexual activity: Yes  Other Topics Concern   Not on file  Social History Narrative   Not on file   Social Determinants of Health   Financial Resource Strain: Low Risk  (04/28/2022)   Received [facility], [facility]   Overall Financial Resource Strain (CARDIA)    Difficulty of Paying Living Expenses: Not hard at all  Food Insecurity: No Food Insecurity  (04/28/2022)   Received from [facility], [facility]   Hunger Vital Sign    Worried About Running Out of Food in the Last Year: Never true    Ran Out of Food in the Last Year: Never true  Transportation Needs: No Transportation Needs (04/28/2022)   Received from [facility], [facility]   PRAPARE - Transportation    Lack of Transportation (Medical): No    Lack of Transportation (Non-Medical): No  Physical Activity: Insufficiently Active (04/28/2022)   Received from [facility], [facility]   Exercise Vital Sign    Days of Exercise per Week: 5 days    Minutes of Exercise per Session: 20 min  Stress: No Stress Concern Present (04/28/2022)   Received from [facility], [facility] - Occupational Stress Questionnaire    Feeling of Stress : Only a little  Social Connections: Socially Integrated (04/28/2022)   Received from [facility], [facility]   Social Network    How would you rate your social network (family, work, friends)?: Good participation with social networks  Intimate Partner Violence: Not At Risk (04/28/2022)   Received from [facility], [facility]   HITS    Over the last 12 months how often did your partner physically hurt you?: 1    Over the last 12 months how often did your partner insult you or talk down to you?: 1    Over the last 12 months how often did your partner threaten you with physical harm?: 1    Over the last 12 months how often did your partner scream or curse at you?: 1   Outpatient Encounter Medications as of 12/08/2022  Medication Sig   allopurinol (ZYLOPRIM) 100 MG tablet TAKE 1 TABLET DAILY   amLODIPine-Valsartan-HCTZ 5-160-12.5 MG TABS Take 1 tablet by mouth daily.   aspirin EC 81 MG tablet Take 1 tablet (81 mg total) by mouth daily.   carvedilol (COREG) 6.25 MG tablet Take 1 tablet (6.25 mg total) by mouth 2 (two) times daily.   cetirizine (ZYRTEC) 10 MG tablet TAKE 1 TABLET AT BEDTIME FOR  ALLERGIES   cyclobenzaprine (FLEXERIL) 10 MG tablet TAKE ONE TABLET TWICE DAILY AS NEEDED   fluticasone (FLONASE) 50 MCG/ACT nasal spray 2 sprays every day until directed to stop   ibuprofen (ADVIL) 800 MG tablet TAKE ONE TABLET BY MOUTH TWICE DAILY AFTER MEALS AS NEEDED FOR PAIN   ipratropium (ATROVENT) 0.06 % nasal spray 2 sprays into each nostril Three (3) times a day.   metFORMIN (GLUCOPHAGE) 500 MG tablet TAKE 1 TABLET 2 TIMES A DAY   metoprolol succinate (TOPROL XL) 25 MG 24 hr tablet Take 3 tablets (75 mg total) by mouth daily for 7 days,  THEN 2 tablets (50 mg total) daily for 7 days, THEN 1 tablet (25 mg total) daily for 7 days.   omeprazole (PRILOSEC) 10 MG capsule TAKE 1 CAPSULE DAILY   ondansetron (ZOFRAN) 4 MG tablet Take 1 tablet (4 mg total) by mouth every 8 (eight) hours as needed for nausea or vomiting.   spironolactone (ALDACTONE) 25 MG tablet Take 1 tablet (25 mg total) by mouth daily.   escitalopram (LEXAPRO) 20 MG tablet Take 1 tablet (20 mg total) by mouth daily.   fenofibrate (TRICOR) 145 MG tablet Take 1 tablet (145 mg total) by mouth daily.   No facility-administered encounter medications on file as of 12/08/2022.    Allergies  Allergen Reactions   Demerol  [Meperidine Hcl] Nausea And Vomiting   Sulfa Antibiotics Other (See Comments)    Muscle and bone pain  Muscle and bone pain    Tape after surgery caused blisters   Wound Dressing Adhesive Other (See Comments)    Tape after surgery caused blisters   Demerol [Meperidine] Nausea And Vomiting   Statins     Muscle and bone pain    Review of Systems As per HPI   Objective:  BP (!) 159/57   Temp 98.2 F (36.8 C)   Ht 5\' 6"  (1.676 m)   Wt 158 lb (71.7 kg)   SpO2 96%   BMI 25.50 kg/m    Wt Readings from Last 3 Encounters:  12/07/22 160 lb 9.6 oz (72.8 kg)  10/25/22 159 lb (72.1 kg)  09/13/22 166 lb (75.3 kg)   Physical Exam Constitutional:      General: She is awake. She is not in acute distress.     Appearance: Normal appearance. She is well-developed and well-groomed. She is not ill-appearing, toxic-appearing or diaphoretic.  Cardiovascular:     Rate and Rhythm: Normal rate and regular rhythm.     Pulses: Normal pulses.          Radial pulses are 2+ on the right side and 2+ on the left side.       Posterior tibial pulses are 2+ on the right side and 2+ on the left side.     Heart sounds: Murmur heard.     No gallop.  Pulmonary:     Effort: Pulmonary effort is normal. No respiratory distress.     Breath sounds: Normal breath sounds. No stridor. No wheezing, rhonchi or rales.  Musculoskeletal:     Cervical back: Full passive range of motion without pain and neck supple.     Right lower leg: No edema.     Left lower leg: No edema.     Right foot: Normal range of motion. No deformity, bunion, Charcot foot, foot drop or prominent metatarsal heads.     Left foot: Normal range of motion. No deformity, bunion, Charcot foot, foot drop or prominent metatarsal heads.  Feet:     Right foot:     Protective Sensation: 10 sites tested.  10 sites sensed.     Toenail Condition: Right toenails are normal.     Left foot:     Protective Sensation: 10 sites tested.  10 sites sensed.     Skin integrity: Dry skin present.     Toenail Condition: Left toenails are normal.  Skin:    General: Skin is warm.     Capillary Refill: Capillary refill takes less than 2 seconds.  Neurological:     General: No focal deficit present.     Mental  Status: She is alert, oriented to person, place, and time and easily aroused. Mental status is at baseline.     GCS: GCS eye subscore is 4. GCS verbal subscore is 5. GCS motor subscore is 6.     Motor: No weakness.  Psychiatric:        Attention and Perception: Attention and perception normal.        Mood and Affect: Mood and affect normal.        Speech: Speech normal.        Behavior: Behavior normal. Behavior is cooperative.        Thought Content: Thought content  normal. Thought content does not include homicidal or suicidal ideation. Thought content does not include homicidal or suicidal plan.        Cognition and Memory: Cognition and memory normal.        Judgment: Judgment normal.     Results for orders placed or performed in visit on 10/25/22  HM DIABETES EYE EXAM  Result Value Ref Range   HM Diabetic Eye Exam No Retinopathy No Retinopathy       12/08/2022    8:55 AM 10/25/2022    8:37 AM 09/13/2022    8:04 AM 08/12/2022   11:31 AM 06/16/2017    3:10 PM  Depression screen PHQ 2/9  Decreased Interest 0 0 0 0 0  Down, Depressed, Hopeless 0 0 1 0 0  PHQ - 2 Score 0 0 1 0 0  Altered sleeping 0 0 0 0   Tired, decreased energy 0 0 1 0   Change in appetite 0 0 0 0   Feeling bad or failure about yourself  0 0 0 0   Trouble concentrating 0 0 0 0   Moving slowly or fidgety/restless 0 0 0 0   Suicidal thoughts 0 0 0 0   PHQ-9 Score 0 0 2 0   Difficult doing work/chores Not difficult at all Not difficult at all Somewhat difficult Not difficult at all        12/08/2022    9:04 AM 10/25/2022    8:38 AM 09/13/2022    8:04 AM 08/12/2022   11:31 AM  GAD 7 : Generalized Anxiety Score  Nervous, Anxious, on Edge 1 1 1  0  Control/stop worrying 0 0 0 0  Worry too much - different things 0 0 0 0  Trouble relaxing 0 0 0 0  Restless 0 0 0 0  Easily annoyed or irritable 0 0 0 0  Afraid - awful might happen 0 0 0 0  Total GAD 7 Score 1 1 1  0  Anxiety Difficulty Not difficult at all Not difficult at all Not difficult at all Not difficult at all   Pertinent labs & imaging results that were available during my care of the patient were reviewed by me and considered in my medical decision making.  Assessment & Plan:  Kiely was seen today for medical management of chronic issues.  Diagnoses and all orders for this visit:  Hypertension associated with diabetes (HCC) Not at goal today in office. Reports that she took her medications late. Discussed with  patient to follow recommendations from Cardiology. Patient to follow with Cardiology and Nephrology.   Type 2 diabetes mellitus without complication, without long-term current use of insulin (HCC) Labs as below. Will communicate results to patient once available. Will await results to determine next steps.  Well controlled at last appt.  Patient does not currently check her BG  at home.  Continue metformin.  Foot exam and microalbumin completed today.  -     Bayer DCA Hb A1c Waived; Future -     Microalbumin / creatinine urine ratio -     Bayer DCA Hb A1c Waived  Hypernatremia Labs as below. Will communicate results to patient once available. Will await results to determine next steps.  -     Urinalysis, Routine w reflex microscopic -     CMP14+EGFR  Gastroesophageal reflux disease without esophagitis Well controlled, continue current medication   OSA on CPAP Encouraged patient to utilize CPAP nightly and with naps.    Continue all other maintenance medications.  Follow up plan: Return in about 3 months (around 03/09/2023) for Chronic Condition Follow up.  Continue healthy lifestyle choices, including diet (rich in fruits, vegetables, and lean proteins, and low in salt and simple carbohydrates) and exercise (at least 30 minutes of moderate physical activity daily).  Written and verbal instructions provided   The above assessment and management plan was discussed with the patient. The patient verbalized understanding of and has agreed to the management plan. Patient is aware to call the clinic if they develop any new symptoms or if symptoms persist or worsen. Patient is aware when to return to the clinic for a follow-up visit. Patient educated on when it is appropriate to go to the emergency department.   Neale Burly, DNP-FNP [facility] [address] [place], [place] [address] [phone]

## 2022-12-08 NOTE — Telephone Encounter (Signed)
Pharmacy Patient Advocate Encounter  Insurance verification completed.    The patient is insured through  [employer]    Ran test claim for Exforge HCT . Currently a quantity of 30 is a 30 day supply and the co-pay is 15.00 . No PA needed.  This test claim was processed through [facility]- copay amounts may vary at other pharmacies due to pharmacy/plan contracts, or as the patient moves through the different stages of their insurance plan.

## 2022-12-08 NOTE — Patient Instructions (Addendum)
Please bring medications to next appt! Thanks   Per Cardiology   Medication Instructions:  Your physician has recommended you make the following change in your medication:    -Stop Lasix -Stop Benazepril -Stop Nifedipine   -Start Amlodipine-Valsartan-HCTZ 5-160-25 mg tablet once daily.   - Wean off of metoprolol succinate 100 mg tablet:             *Take 75 mg for 1 week             *Take 50 mg for 1 week             *Take 25 mg for 1 week   -Start coreg 6.25 mg twice daily after stopping Metoprolol

## 2022-12-09 LAB — MICROALBUMIN / CREATININE URINE RATIO
Creatinine, Urine: 206.6 mg/dL
Microalb/Creat Ratio: 38 mg/g{creat} — ABNORMAL HIGH (ref 0–29)
Microalbumin, Urine: 77.9 ug/mL

## 2022-12-09 NOTE — Progress Notes (Signed)
Elevated microalbumin/crt. Recommend patient follow up with Nephrology. Slight variation with Calcium, alk phos, some variation is expected. Urinalysis shows signs of dehydration, recommend 80-100 oz of water daily.

## 2022-12-14 ENCOUNTER — Other Ambulatory Visit: Payer: Self-pay | Admitting: Family Medicine

## 2022-12-14 DIAGNOSIS — E119 Type 2 diabetes mellitus without complications: Secondary | ICD-10-CM

## 2022-12-19 ENCOUNTER — Other Ambulatory Visit: Payer: Self-pay | Admitting: Family Medicine

## 2022-12-19 DIAGNOSIS — I152 Hypertension secondary to endocrine disorders: Secondary | ICD-10-CM

## 2022-12-26 ENCOUNTER — Telehealth: Payer: Self-pay | Admitting: Internal Medicine

## 2022-12-26 NOTE — Telephone Encounter (Signed)
Spoke with pt who states that since last visit her blood pressure has been elevated. She denies dizziness, but does c/o headache at times. Recent blood pressures as follows 195/88,200/88, and 185/77. No other complaints. Please advise.

## 2022-12-26 NOTE — Telephone Encounter (Signed)
Pt c/o BP issue: STAT if pt c/o blurred vision, one-sided weakness or slurred speech  1. What are your last 5 BP readings?  188-200/77-88  2. Are you having any other symptoms (ex. Dizziness, headache, blurred vision, passed out)? No 3. What is your BP issue? Patient's BP has been high. Patient had an appointment on 09/04 and Dr. Jenene Slicker adjusted her medications. Since the medications were changed her BP has been running high. Please advise.

## 2022-12-27 NOTE — Telephone Encounter (Signed)
Patient was returning call. Please advise ?

## 2022-12-27 NOTE — Telephone Encounter (Signed)
Left a message for patient to call office back for provider recommendations.

## 2022-12-28 ENCOUNTER — Other Ambulatory Visit: Payer: Self-pay | Admitting: Internal Medicine

## 2022-12-28 MED ORDER — AMLODIPINE-VALSARTAN-HCTZ 10-320-25 MG PO TABS: 1.0000 | ORAL_TABLET | Freq: Every day | ORAL | 3 refills | Status: DC

## 2022-12-28 NOTE — Telephone Encounter (Signed)
MyChart message sent to pt regarding increase in medication as instructed by provider. Will send new prescription to pharmacy.

## 2022-12-29 ENCOUNTER — Telehealth: Payer: Self-pay

## 2022-12-29 ENCOUNTER — Other Ambulatory Visit: Payer: Self-pay

## 2022-12-29 MED ORDER — AMLODIPINE BESYLATE 10 MG PO TABS: 10.0000 mg | ORAL_TABLET | Freq: Every day | ORAL | 3 refills | Status: DC

## 2022-12-29 MED ORDER — HYDROCHLOROTHIAZIDE 25 MG PO TABS
25.0000 mg | ORAL_TABLET | Freq: Every day | ORAL | 3 refills | Status: DC
Start: 2022-12-29 — End: 2022-12-29

## 2022-12-29 MED ORDER — VALSARTAN 320 MG PO TABS: 320.0000 mg | ORAL_TABLET | Freq: Every day | ORAL | 3 refills | Status: DC

## 2022-12-29 MED ORDER — AMLODIPINE BESYLATE 10 MG PO TABS
10.0000 mg | ORAL_TABLET | Freq: Every day | ORAL | 3 refills | Status: DC
Start: 2022-12-29 — End: 2022-12-29

## 2022-12-29 MED ORDER — HYDROCHLOROTHIAZIDE 25 MG PO TABS: 25.0000 mg | ORAL_TABLET | Freq: Every day | ORAL | 3 refills | Status: DC

## 2022-12-29 NOTE — Telephone Encounter (Signed)
Patient is returning phone call back.

## 2022-12-29 NOTE — Telephone Encounter (Signed)
Aml-val-hydrochlorothiazide combination medication not covered by insurance. Provider recommended sending in 3 different prescriptions to replace combination tablets  Amlodipine 10 mg Valsartan 320 mg  HCTZ 25 mg

## 2022-12-29 NOTE — Addendum Note (Signed)
Addended by: Kerney Elbe on: 12/29/2022 04:17 PM   Modules accepted: Orders

## 2022-12-29 NOTE — Telephone Encounter (Signed)
Returned call to pt who states that medication needs to be sent to [facility].

## 2022-12-29 NOTE — Telephone Encounter (Signed)
Medications were sent to wrong pharmacy. Pt needs them to be sent to [facility].

## 2023-01-25 ENCOUNTER — Ambulatory Visit: Payer: 59

## 2023-02-03 ENCOUNTER — Encounter: Payer: Self-pay | Admitting: *Deleted

## 2023-02-16 ENCOUNTER — Ambulatory Visit: Payer: BC Managed Care – PPO | Attending: Cardiology | Admitting: Pharmacist

## 2023-02-16 ENCOUNTER — Telehealth: Payer: Self-pay | Admitting: Pharmacist

## 2023-02-16 ENCOUNTER — Encounter: Payer: Self-pay | Admitting: Pharmacist

## 2023-02-16 DIAGNOSIS — T466X5A Adverse effect of antihyperlipidemic and antiarteriosclerotic drugs, initial encounter: Secondary | ICD-10-CM

## 2023-02-16 DIAGNOSIS — E785 Hyperlipidemia, unspecified: Secondary | ICD-10-CM | POA: Diagnosis not present

## 2023-02-16 DIAGNOSIS — E781 Pure hyperglyceridemia: Secondary | ICD-10-CM

## 2023-02-16 DIAGNOSIS — G720 Drug-induced myopathy: Secondary | ICD-10-CM

## 2023-02-16 DIAGNOSIS — E1169 Type 2 diabetes mellitus with other specified complication: Secondary | ICD-10-CM | POA: Diagnosis not present

## 2023-02-16 NOTE — Telephone Encounter (Signed)
Please complete PA for Repatha and Vascepa

## 2023-02-16 NOTE — Progress Notes (Unsigned)
Patient ID: [name]                 DOB: 09/15/1960                    MRN: [id number]     HPI: [name] is a 62 y.o. female patient referred to lipid clinic by Dr Jenene Slicker. PMH is significant for T2DM, familial hyperlipidemia, OSA, HLD, aortic valve stenosis, hypertriglyceridemia, family history of CAD, history of smoking, and statin intolerance.  Patient presents today to discuss hyperlipidemia. Reports a history of elevated cholesterol and believes it is genetic.   Both parents had HLD. Father had CAD and grandfather had carotid artery stenosis.  Intolerant to atorvastatin, pravastatin and simvastatin. All caused myalgias.  Patient was in the Repatha clinical trials and it effectively lowered her LDL. Unfortunately could not afford once trial concluded.  Current Medications: Fenofibrate 145mg    Intolerances:  Atorvastatin Pravastatin Simvastatin  Risk Factors:  History of smoking T2DM Possible familial hyperlipidemia Aortic valve stenosis  LDL goal: <55  Labs: TC 231, Trigs 184, HDL 50, LDL 148 (08/12/22)  Past Medical History:  Diagnosis Date   Anxiety    Complication of anesthesia    Depression    DJD (degenerative joint disease) of knee    Elevated liver enzymes    Gout    Hot flashes    Hyperlipidemia    Hypertension    Pneumonia    PONV (postoperative nausea and vomiting)    Very per pt.   Seasonal allergies    Vitamin D deficiency     Current Outpatient Medications on File Prior to Visit  Medication Sig Dispense Refill   albuterol (VENTOLIN HFA) 108 (90 Base) MCG/ACT inhaler Inhale into the lungs.     allopurinol (ZYLOPRIM) 100 MG tablet TAKE ONE TABLET DAILY 60 tablet 3   ALPRAZolam (XANAX) 0.5 MG tablet Take by mouth.     amLODipine (NORVASC) 10 MG tablet Take 1 tablet (10 mg total) by mouth daily. 90 tablet 3   amoxicillin-clavulanate (AUGMENTIN) 875-125 MG tablet Take by mouth.     aspirin EC 81 MG tablet Take 1 tablet (81  mg total) by mouth daily. 60 tablet 0   carvedilol (COREG) 6.25 MG tablet Take 1 tablet (6.25 mg total) by mouth 2 (two) times daily. 180 tablet 3   cetirizine (ZYRTEC) 10 MG tablet TAKE 1 TABLET AT BEDTIME FOR ALLERGIES 60 tablet 5   cyclobenzaprine (FLEXERIL) 10 MG tablet TAKE ONE TABLET TWICE DAILY AS NEEDED     escitalopram (LEXAPRO) 20 MG tablet Take 1 tablet (20 mg total) by mouth daily. 60 tablet 0   fenofibrate (TRICOR) 145 MG tablet Take 1 tablet (145 mg total) by mouth daily. 30 tablet 1   fluticasone (FLONASE) 50 MCG/ACT nasal spray 2 sprays every day until directed to stop     hydrochlorothiazide (HYDRODIURIL) 25 MG tablet Take 1 tablet (25 mg total) by mouth daily. 90 tablet 3   ibuprofen (ADVIL) 800 MG tablet TAKE ONE TABLET BY MOUTH TWICE DAILY AFTER MEALS AS NEEDED FOR PAIN     ipratropium (ATROVENT) 0.06 % nasal spray 2 sprays into each nostril Three (3) times a day.     metFORMIN (GLUCOPHAGE) 500 MG tablet TAKE ONE TABLET TWICE DAILY 180 tablet 0   metoprolol succinate (TOPROL XL) 25 MG 24 hr tablet Take 3 tablets (75 mg total) by mouth daily for 7 days, THEN 2 tablets (50 mg total) daily  for 7 days, THEN 1 tablet (25 mg total) daily for 7 days. 42 tablet 0   omeprazole (PRILOSEC) 10 MG capsule TAKE 1 CAPSULE DAILY 30 capsule 4   ondansetron (ZOFRAN) 4 MG tablet Take 1 tablet (4 mg total) by mouth every 8 (eight) hours as needed for nausea or vomiting. 20 tablet 0   Semaglutide,0.25 or 0.5MG /DOS, (OZEMPIC, 0.25 OR 0.5 MG/DOSE,) 2 MG/3ML SOPN Inject 0.5mg  subcutaneously every week     spironolactone (ALDACTONE) 25 MG tablet TAKE ONE TABLET BY MOUTH DAILY 90 tablet 0   valsartan (DIOVAN) 320 MG tablet Take 1 tablet (320 mg total) by mouth daily. 90 tablet 3   No current facility-administered medications on file prior to visit.    Allergies  Allergen Reactions   Demerol  [Meperidine Hcl] Nausea And Vomiting   Sulfa Antibiotics Other (See Comments)    Muscle and bone pain   Muscle and bone pain    Tape after surgery caused blisters   Wound Dressing Adhesive Other (See Comments)    Tape after surgery caused blisters   Demerol [Meperidine] Nausea And Vomiting   Statins     Muscle and bone pain    Assessment/Plan:  1. Hyperlipidemia - Patient last LDL 148 which is above goal of <55. Aggressive goal due to T2DM, smoking history, and family history of CAD. Unfortunately intolerant to statins. Recommend addition of PCSK9i and Vascepa.  Using demo pen, educated patient on mechansim of action, storage, site selection, administration, and possible adverse effects. Will complete PA and contact patient when approved. Will also submit PA for Vascepa for hypertriglyceridemia. Recheck lipid panel in 2-3 months. May be able to d/c fenofibrate if trigs reach goal. Educated on how to Engineer, manufacturing systems coupons.  Continue fenofibrate 145mg  daily Start repatha 140mg  q 2 weeks Start Vascepa 2g BID Recheck lipid panel in 2-3 months  Laural Golden, PharmD, BCACP, CDCES, CPP [address], [address] [place], [place], [address] Phone: [phone], Fax: [phone]

## 2023-02-16 NOTE — Patient Instructions (Addendum)
It was nice meeting you today  We would like your LDL (bad cholesterol) to be less than 55  We would like your triglycerides to be less than 150  The medications we discussed today are called Repatha and Vascepa. Repatha is administered once every 2 weeks. I will complete the prior authorization and contact you when it is approved. We will recheck your fasting lipid panel in 2-3 months  For your triglycerides, please continue your fenofibrate 145mg  daily. We recommend starting Vascepa, 2 capsules twice a day with food.   Please message [place] with any questions  Laural Golden, PharmD, BCACP, CDCES, CPP [address], [address] [place], [place], [address] Phone: [phone], Fax: [phone]

## 2023-02-17 ENCOUNTER — Other Ambulatory Visit (HOSPITAL_COMMUNITY): Payer: Self-pay

## 2023-02-17 ENCOUNTER — Telehealth: Payer: Self-pay | Admitting: Pharmacy Technician

## 2023-02-17 DIAGNOSIS — E1169 Type 2 diabetes mellitus with other specified complication: Secondary | ICD-10-CM

## 2023-02-17 DIAGNOSIS — E7849 Other hyperlipidemia: Secondary | ICD-10-CM

## 2023-02-17 DIAGNOSIS — Z789 Other specified health status: Secondary | ICD-10-CM

## 2023-02-17 MED ORDER — ICOSAPENT ETHYL 1 G PO CAPS: ORAL_CAPSULE | ORAL | 5 refills | Status: DC

## 2023-02-17 NOTE — Telephone Encounter (Addendum)
Pharmacy Patient Advocate Encounter   Received notification from Pt Calls Messages that prior authorization for repatha is required/requested.   Insurance verification completed.   The patient is insured through [facility] ADVANTAGE/RX AD[employer]ANCE .   Per test claim: PA required; PA submitted to above mentioned insurance via CoverMyMeds Key/confirmation #/EOC [id number] Status is pending

## 2023-02-22 ENCOUNTER — Other Ambulatory Visit (HOSPITAL_COMMUNITY): Payer: Self-pay

## 2023-02-23 ENCOUNTER — Telehealth: Payer: Self-pay | Admitting: Pharmacy Technician

## 2023-02-23 NOTE — Telephone Encounter (Signed)
Pharmacy Patient Advocate Encounter   Received notification from CoverMyMeds that prior authorization for vascepa is required/requested.   Insurance verification completed.   The patient is insured through [facility] .   Per test claim: PA required; PA submitted to above mentioned insurance via CoverMyMeds Key/confirmation #/EOC [id number] Status is pending

## 2023-02-24 NOTE — Telephone Encounter (Signed)
Pharmacy Patient Advocate Encounter  Received notification from [employer] [facility] that Prior Authorization for vascepa has been DENIED.  Full denial letter will be uploaded to the media tab. See denial reason below.   PA #/Case ID/Reference #: [id number]

## 2023-02-28 ENCOUNTER — Other Ambulatory Visit (HOSPITAL_COMMUNITY): Payer: Self-pay

## 2023-03-03 ENCOUNTER — Other Ambulatory Visit (HOSPITAL_COMMUNITY): Payer: Self-pay

## 2023-03-03 NOTE — Telephone Encounter (Addendum)
Sent documentation to verify statin allergy. Status pending- had to fax over as appeal as they denied-pending

## 2023-03-07 ENCOUNTER — Other Ambulatory Visit (HOSPITAL_COMMUNITY): Payer: Self-pay

## 2023-03-10 ENCOUNTER — Other Ambulatory Visit (HOSPITAL_COMMUNITY): Payer: Self-pay

## 2023-03-11 ENCOUNTER — Other Ambulatory Visit (HOSPITAL_COMMUNITY): Payer: Self-pay

## 2023-03-11 ENCOUNTER — Other Ambulatory Visit: Payer: Self-pay | Admitting: Family Medicine

## 2023-03-11 DIAGNOSIS — E119 Type 2 diabetes mellitus without complications: Secondary | ICD-10-CM

## 2023-03-14 ENCOUNTER — Ambulatory Visit: Payer: BC Managed Care – PPO | Admitting: Family Medicine

## 2023-03-14 ENCOUNTER — Encounter: Payer: Self-pay | Admitting: Family Medicine

## 2023-03-14 VITALS — BP 132/71 | HR 55 | Temp 97.6°F | Ht 66.0 in | Wt 165.2 lb

## 2023-03-14 DIAGNOSIS — F419 Anxiety disorder, unspecified: Secondary | ICD-10-CM

## 2023-03-14 DIAGNOSIS — E119 Type 2 diabetes mellitus without complications: Secondary | ICD-10-CM

## 2023-03-14 DIAGNOSIS — E1169 Type 2 diabetes mellitus with other specified complication: Secondary | ICD-10-CM

## 2023-03-14 DIAGNOSIS — G4733 Obstructive sleep apnea (adult) (pediatric): Secondary | ICD-10-CM

## 2023-03-14 DIAGNOSIS — I152 Hypertension secondary to endocrine disorders: Secondary | ICD-10-CM

## 2023-03-14 DIAGNOSIS — Z7984 Long term (current) use of oral hypoglycemic drugs: Secondary | ICD-10-CM

## 2023-03-14 DIAGNOSIS — E1159 Type 2 diabetes mellitus with other circulatory complications: Secondary | ICD-10-CM

## 2023-03-14 DIAGNOSIS — F330 Major depressive disorder, recurrent, mild: Secondary | ICD-10-CM | POA: Diagnosis not present

## 2023-03-14 DIAGNOSIS — K219 Gastro-esophageal reflux disease without esophagitis: Secondary | ICD-10-CM

## 2023-03-14 DIAGNOSIS — E785 Hyperlipidemia, unspecified: Secondary | ICD-10-CM

## 2023-03-14 MED ORDER — ESCITALOPRAM OXALATE 20 MG PO TABS: 20.0000 mg | ORAL_TABLET | Freq: Every day | ORAL | 1 refills | Status: DC

## 2023-03-14 MED ORDER — LANCET DEVICE MISC: 1.0000 | Freq: Three times a day (TID) | 0 refills | Status: AC

## 2023-03-14 MED ORDER — METFORMIN HCL 500 MG PO TABS: 500.0000 mg | ORAL_TABLET | Freq: Two times a day (BID) | ORAL | 0 refills | Status: DC

## 2023-03-14 MED ORDER — BLOOD GLUCOSE TEST VI STRP: 1.0000 | ORAL_STRIP | Freq: Three times a day (TID) | 0 refills | Status: AC

## 2023-03-14 MED ORDER — BLOOD GLUCOSE MONITORING SUPPL DEVI: 1.0000 | Freq: Three times a day (TID) | 0 refills | Status: AC

## 2023-03-14 MED ORDER — LANCETS MISC. MISC: 1.0000 | Freq: Three times a day (TID) | 0 refills | Status: AC

## 2023-03-14 NOTE — Progress Notes (Signed)
Subjective:  Patient ID: [name], female    DOB: June 24, 1960, 62 y.o.   MRN: [id number]  Patient Care Team: Arrie Senate, FNP as PCP - General (Family Medicine) Delora Fuel, OD (Optometry)   Chief Complaint:  Medical Management of Chronic Issues (3 month)   HPI: [name] is a 62 y.o. female presenting on 03/14/2023 for Medical Management of Chronic Issues (3 month)  HPI 1. Hypertension associated with Diabetes  Established with Cardiology  Has appt on 03/22/23 for follow up.  Has BP monitor at home Yes BP at home average 135/60 ROS Denies anxiety, fatigue, peripheral edema, changes to vision, chest pain, headaches, palpitations, sweats, SOB, PND, orthopnea Meds amlodipine, coreg, hydrochlorothiazide, valsartan CAD risks Diabetes Mellitus, hypertension, hypercholesterolemia/hyperlipidemia  2. OSA on CPAP Continues to use CPAP Doing well, without side effects  Denies daytime drowsiness   3. Gastroesophageal reflux disease without esophagitis Compliant with medications - yes Current medications - prilosec  Adverse side effects - none  Sore throat - n/a  Voice change - n/a  Hemoptysis - n/a  Dysphagia or dyspepsia - n/a  Water brash - no  Red Flags (weight loss, hematochezia, melena, weight loss, early satiety, fevers, odynophagia, or persistent vomiting) - no   4. T2DM  Glucometer: does not have one   Taking medication(s): meformin,.  Last eye exam: 04/26/22 Last foot exam: 12/08/2023 Last A1c:  Lab Results  Component Value Date   HGBA1C 5.6 12/08/2022   Nephropathy screen indicated?: 12/08/2022 Last flu, zoster and/or pneumovax:  Immunization History  Administered Date(s) Administered   Fluzone Influenza virus vaccine,trivalent (IIV3), split virus 01/25/2020   Influenza-Unspecified 01/25/2020, 01/11/2021   Moderna Sars-Covid-2 Vaccination 02/12/2020, 03/18/2020   PFIZER SARS-COV-2 Pediatric Vaccination 5-58yrs 03/26/2020    PPD Test 01/08/2020   Tdap 12/22/2009    ROS: Denies dizziness, LOC, polyuria, polydipsia, unintended weight loss/gain, foot ulcerations, numbness or tingling in extremities, shortness of breath or chest pain.  5. Mild episode of recurrent major depressive disorder (HCC)/Anxiety States that things are going well. She does not wish to start counseling at this time. Reports that with her job change she is doing much better.   6. Hyperlipidemia  Established with lipid clinic. She is working with them to be approved for [place].    Relevant past medical, surgical, family, and social history reviewed and updated as indicated.  Allergies and medications reviewed and updated. Data reviewed: Chart in Epic.   Past Medical History:  Diagnosis Date   Anxiety    Complication of anesthesia    Depression    DJD (degenerative joint disease) of knee    Elevated liver enzymes    Gout    Hot flashes    Hyperlipidemia    Hypertension    Pneumonia    PONV (postoperative nausea and vomiting)    Very per pt.   Seasonal allergies    Vitamin D deficiency     Past Surgical History:  Procedure Laterality Date   CARPAL TUNNEL RELEASE     right   CESAREAN SECTION  1990   COLONOSCOPY     KNEE ARTHROSCOPY     right x 2   TOTAL KNEE ARTHROPLASTY Right 09/24/2018   Procedure: TOTAL KNEE ARTHROPLASTY;  Surgeon: Salvatore Marvel, MD;  Location: [facility];  Service: Orthopedics;  Laterality: Right;    Social History   Socioeconomic History   Marital status: Married    Spouse name: Not on file  Number of children: 1   Years of education: Not on file   Highest education level: Not on file  Occupational History   Not on file  Tobacco Use   Smoking status: Former    Current packs/day: 0.00    Average packs/day: 1 pack/day for 18.0 years (18.0 ttl pk-yrs)    Types: Cigarettes    Start date: 03/05/1986    Quit date: 03/05/2004    Years since quitting: 19.0   Smokeless tobacco: Never   Vaping Use   Vaping status: Never Used  Substance and Sexual Activity   Alcohol use: No   Drug use: No   Sexual activity: Yes  Other Topics Concern   Not on file  Social History Narrative   Not on file   Social Determinants of Health   Financial Resource Strain: Low Risk  (04/28/2022)   Received from [facility], [facility]   Overall Financial Resource Strain (CARDIA)    Difficulty of Paying Living Expenses: Not hard at all  Food Insecurity: No Food Insecurity (04/28/2022)   Received from [facility], [facility]   Hunger Vital Sign    Worried About Running Out of Food in the Last Year: Never true    Ran Out of Food in the Last Year: Never true  Transportation Needs: No Transportation Needs (04/28/2022)   Received from [facility]   PRAPARE - Transportation    Lack of Transportation (Medical): No    Lack of Transportation (Non-Medical): No  Physical Activity: Insufficiently Active (04/28/2022)   Received from [facility], [facility]   Exercise Vital Sign    Days of Exercise per Week: 5 days    Minutes of Exercise per Session: 20 min  Stress: No Stress Concern Present (04/28/2022)   Received from [facility], [facility] - Occupational Stress Questionnaire    Feeling of Stress : Only a little  Social Connections: Socially Integrated (04/28/2022)   Received from [facility], [facility]   Social Network    How would you rate your social network (family, work, friends)?: Good participation with social networks  Intimate Partner Violence: Not At Risk (04/28/2022)   Received from [facility], [facility]   HITS    Over the last 12 months how often did your partner physically hurt you?: Never    Over the last 12 months how often did your partner insult you or talk down to you?: Never    Over the last 12 months how often did your partner threaten you with physical harm?: Never    Over the last 12  months how often did your partner scream or curse at you?: Never    Outpatient Encounter Medications as of 03/14/2023  Medication Sig   albuterol (VENTOLIN HFA) 108 (90 Base) MCG/ACT inhaler Inhale into the lungs.   allopurinol (ZYLOPRIM) 100 MG tablet TAKE ONE TABLET DAILY   ALPRAZolam (XANAX) 0.5 MG tablet Take by mouth.   amLODipine (NORVASC) 10 MG tablet Take 1 tablet (10 mg total) by mouth daily.   aspirin EC 81 MG tablet Take 1 tablet (81 mg total) by mouth daily.   carvedilol (COREG) 6.25 MG tablet Take 1 tablet (6.25 mg total) by mouth 2 (two) times daily.   cetirizine (ZYRTEC) 10 MG tablet TAKE 1 TABLET AT BEDTIME FOR ALLERGIES   cyclobenzaprine (FLEXERIL) 10 MG tablet TAKE ONE TABLET TWICE DAILY AS NEEDED   fluticasone (FLONASE) 50 MCG/ACT nasal spray 2  sprays every day until directed to stop   hydrochlorothiazide (HYDRODIURIL) 25 MG tablet Take 1 tablet (25 mg total) by mouth daily.   ibuprofen (ADVIL) 800 MG tablet TAKE ONE TABLET BY MOUTH TWICE DAILY AFTER MEALS AS NEEDED FOR PAIN   icosapent Ethyl (VASCEPA) 1 g capsule Take 2 capsules by mouth twice daily with food   ipratropium (ATROVENT) 0.06 % nasal spray 2 sprays into each nostril Three (3) times a day.   metFORMIN (GLUCOPHAGE) 500 MG tablet TAKE ONE TABLET TWICE DAILY   omeprazole (PRILOSEC) 10 MG capsule TAKE 1 CAPSULE DAILY   ondansetron (ZOFRAN) 4 MG tablet Take 1 tablet (4 mg total) by mouth every 8 (eight) hours as needed for nausea or vomiting.   Semaglutide,0.25 or 0.5MG /DOS, (OZEMPIC, 0.25 OR 0.5 MG/DOSE,) 2 MG/3ML SOPN Inject 0.5mg  subcutaneously every week   spironolactone (ALDACTONE) 25 MG tablet TAKE ONE TABLET BY MOUTH DAILY   valsartan (DIOVAN) 320 MG tablet Take 1 tablet (320 mg total) by mouth daily.   escitalopram (LEXAPRO) 20 MG tablet Take 1 tablet (20 mg total) by mouth daily.   fenofibrate (TRICOR) 145 MG tablet Take 1 tablet (145 mg total) by mouth daily.   metoprolol succinate (TOPROL XL) 25 MG 24  hr tablet Take 3 tablets (75 mg total) by mouth daily for 7 days, THEN 2 tablets (50 mg total) daily for 7 days, THEN 1 tablet (25 mg total) daily for 7 days.   No facility-administered encounter medications on file as of 03/14/2023.    Allergies  Allergen Reactions   Demerol  [Meperidine Hcl] Nausea And Vomiting   Sulfa Antibiotics Other (See Comments)    Muscle and bone pain  Muscle and bone pain    Tape after surgery caused blisters   Wound Dressing Adhesive Other (See Comments)    Tape after surgery caused blisters   Demerol [Meperidine] Nausea And Vomiting   Statins     Muscle and bone pain    Review of Systems As per HPI  Objective:  BP 132/71   Pulse (!) 55   Temp 97.6 F (36.4 C) (Temporal)   Ht 5\' 6"  (1.676 m)   Wt 165 lb 3.2 oz (74.9 kg)   SpO2 99%   BMI 26.66 kg/m    Wt Readings from Last 3 Encounters:  03/14/23 165 lb 3.2 oz (74.9 kg)  12/08/22 158 lb (71.7 kg)  12/07/22 160 lb 9.6 oz (72.8 kg)    Physical Exam Constitutional:      General: She is awake. She is not in acute distress.    Appearance: Normal appearance. She is well-developed and well-groomed. She is not ill-appearing, toxic-appearing or diaphoretic.  Cardiovascular:     Rate and Rhythm: Normal rate and regular rhythm.     Pulses: Normal pulses.          Radial pulses are 2+ on the right side and 2+ on the left side.       Posterior tibial pulses are 2+ on the right side and 2+ on the left side.     Heart sounds: Murmur heard.     No gallop.  Pulmonary:     Effort: Pulmonary effort is normal. No respiratory distress.     Breath sounds: Normal breath sounds. No stridor. No wheezing, rhonchi or rales.  Musculoskeletal:     Cervical back: Full passive range of motion without pain and neck supple.     Right lower leg: No edema.     Left  lower leg: No edema.  Skin:    General: Skin is warm.     Capillary Refill: Capillary refill takes less than 2 seconds.  Neurological:     General: No  focal deficit present.     Mental Status: She is alert, oriented to person, place, and time and easily aroused. Mental status is at baseline.     GCS: GCS eye subscore is 4. GCS verbal subscore is 5. GCS motor subscore is 6.     Motor: No weakness.  Psychiatric:        Attention and Perception: Attention and perception normal.        Mood and Affect: Mood and affect normal.        Speech: Speech normal.        Behavior: Behavior normal. Behavior is cooperative.        Thought Content: Thought content normal. Thought content does not include homicidal or suicidal ideation. Thought content does not include homicidal or suicidal plan.        Cognition and Memory: Cognition and memory normal.        Judgment: Judgment normal.     Results for orders placed or performed in visit on 12/08/22  Microscopic Examination   Urine  Result Value Ref Range   WBC, UA 0-5 0 - 5 /hpf   RBC, Urine None seen 0 - 2 /hpf   Epithelial Cells (non renal) 0-10 0 - 10 /hpf   Renal Epithel, UA None seen None seen /hpf   Casts Present (A) None seen /lpf   Cast Type Hyaline casts N/A   Mucus, UA Present (A) Not Estab.   Bacteria, UA Few (A) None seen/Few   Yeast, UA None seen None seen  Urinalysis, Routine w reflex microscopic  Result Value Ref Range   Specific Gravity, UA 1.020 1.005 - 1.030   pH, UA 5.5 5.0 - 7.5   Color, UA Yellow Yellow   Appearance Ur Clear Clear   Leukocytes,UA Negative Negative   Protein,UA 1+ (A) Negative/Trace   Glucose, UA Negative Negative   Ketones, UA Trace (A) Negative   RBC, UA Negative Negative   Bilirubin, UA Negative Negative   Urobilinogen, Ur 0.2 0.2 - 1.0 mg/dL   Nitrite, UA Negative Negative   Microscopic Examination See below:   CMP14+EGFR  Result Value Ref Range   Glucose 100 (H) 70 - 99 mg/dL   BUN 22 8 - 27 mg/dL   Creatinine, Ser 7.84 0.57 - 1.00 mg/dL   eGFR 73 >69 GE/XBM/8.41   BUN/Creatinine Ratio 24 12 - 28   Sodium 143 134 - 144 mmol/L   Potassium  3.7 3.5 - 5.2 mmol/L   Chloride 102 96 - 106 mmol/L   CO2 26 20 - 29 mmol/L   Calcium 10.5 (H) 8.7 - 10.3 mg/dL   Total Protein 6.8 6.0 - 8.5 g/dL   Albumin 4.5 3.9 - 4.9 g/dL   Globulin, Total 2.3 1.5 - 4.5 g/dL   Bilirubin Total 0.3 0.0 - 1.2 mg/dL   Alkaline Phosphatase 37 (L) 44 - 121 IU/L   AST 14 0 - 40 IU/L   ALT 15 0 - 32 IU/L  Microalbumin / creatinine urine ratio  Result Value Ref Range   Creatinine, Urine 206.6 Not Estab. mg/dL   Microalbumin, Urine 32.4 Not Estab. ug/mL   Microalb/Creat Ratio 38 (H) 0 - 29 mg/g creat  Bayer DCA Hb A1c Waived  Result Value Ref Range   HB A1C (  BAYER DCA - WAIVED) 5.6 4.8 - 5.6 %       03/14/2023    9:39 AM 12/08/2022    8:55 AM 10/25/2022    8:37 AM 09/13/2022    8:04 AM 08/12/2022   11:31 AM  Depression screen PHQ 2/9  Decreased Interest 0 0 0 0 0  Down, Depressed, Hopeless 0 0 0 1 0  PHQ - 2 Score 0 0 0 1 0  Altered sleeping 0 0 0 0 0  Tired, decreased energy 0 0 0 1 0  Change in appetite 0 0 0 0 0  Feeling bad or failure about yourself  0 0 0 0 0  Trouble concentrating 0 0 0 0 0  Moving slowly or fidgety/restless 0 0 0 0 0  Suicidal thoughts 0 0 0 0 0  PHQ-9 Score 0 0 0 2 0  Difficult doing work/chores Not difficult at all Not difficult at all Not difficult at all Somewhat difficult Not difficult at all       03/14/2023    9:39 AM 12/08/2022    9:04 AM 10/25/2022    8:38 AM 09/13/2022    8:04 AM  GAD 7 : Generalized Anxiety Score  Nervous, Anxious, on Edge 0 1 1 1   Control/stop worrying 0 0 0 0  Worry too much - different things 0 0 0 0  Trouble relaxing 0 0 0 0  Restless 0 0 0 0  Easily annoyed or irritable 0 0 0 0  Afraid - awful might happen 0 0 0 0  Total GAD 7 Score 0 1 1 1   Anxiety Difficulty Not difficult at all Not difficult at all Not difficult at all Not difficult at all      Pertinent labs & imaging results that were available during my care of the patient were reviewed by me and considered in my medical  decision making.  Assessment & Plan:  Lariana was seen today for medical management of chronic issues.  Diagnoses and all orders for this visit:  Hypertension associated with diabetes (HCC) Slightly elevated in office. Encouraged patient to continue to monitor BP at home and to follow up with cardiology and nephrology. Reviewed notes from [facility], MD 12/07/22  Type 2 diabetes mellitus with other specified complication, without long-term current use of insulin (HCC) Patient is unsure if she had true diagnosis of Type 2 Diabetes. Well controlled at this time. Will continue medication as below. Provided BG meter and kit for patient to monitor BG when she is symptomatic.  -     metFORMIN (GLUCOPHAGE) 500 MG tablet; Take 1 tablet (500 mg total) by mouth 2 (two) times daily. -     Blood Glucose Monitoring Suppl DEVI; 1 each by Does not apply route in the morning, at noon, and at bedtime. May substitute to any manufacturer covered by patient's insurance. -     Glucose Blood (BLOOD GLUCOSE TEST STRIPS) STRP; 1 each by In Vitro route in the morning, at noon, and at bedtime. May substitute to any manufacturer covered by patient's insurance. -     Lancet Device MISC; 1 each by Does not apply route in the morning, at noon, and at bedtime. May substitute to any manufacturer covered by patient's insurance. -     Lancets Misc. MISC; 1 each by Does not apply route in the morning, at noon, and at bedtime. May substitute to any manufacturer covered by patient's insurance.  Diabetes mellitus treated with oral medication (HCC) As above.  -  metFORMIN (GLUCOPHAGE) 500 MG tablet; Take 1 tablet (500 mg total) by mouth 2 (two) times daily. -     Blood Glucose Monitoring Suppl DEVI; 1 each by Does not apply route in the morning, at noon, and at bedtime. May substitute to any manufacturer covered by patient's insurance. -     Glucose Blood (BLOOD GLUCOSE TEST STRIPS) STRP; 1 each by In Vitro route in the morning,  at noon, and at bedtime. May substitute to any manufacturer covered by patient's insurance. -     Lancet Device MISC; 1 each by Does not apply route in the morning, at noon, and at bedtime. May substitute to any manufacturer covered by patient's insurance. -     Lancets Misc. MISC; 1 each by Does not apply route in the morning, at noon, and at bedtime. May substitute to any manufacturer covered by patient's insurance.  Hyperlipidemia associated with type 2 diabetes mellitus (HCC) Established with lipid clinic. Reviewed notes from 02/16/23. Patient to follow up with specialty.   Mild episode of recurrent major depressive disorder (HCC) Well controlled. Denies SI. Declines counseling. Refill provided.  -     escitalopram (LEXAPRO) 20 MG tablet; Take 1 tablet (20 mg total) by mouth daily.  Anxiety As above.  -     escitalopram (LEXAPRO) 20 MG tablet; Take 1 tablet (20 mg total) by mouth daily.  Gastroesophageal reflux disease without esophagitis Well controlled. Continue current regimen.   OSA on CPAP Well controlled. Continue current regimen.    Continue all other maintenance medications.  Follow up plan: Return in about 3 months (around 06/12/2023) for Chronic Condition Follow up.   Continue healthy lifestyle choices, including diet (rich in fruits, vegetables, and lean proteins, and low in salt and simple carbohydrates) and exercise (at least 30 minutes of moderate physical activity daily).  Written and verbal instructions provided   The above assessment and management plan was discussed with the patient. The patient verbalized understanding of and has agreed to the management plan. Patient is aware to call the clinic if they develop any new symptoms or if symptoms persist or worsen. Patient is aware when to return to the clinic for a follow-up visit. Patient educated on when it is appropriate to go to the emergency department.   Neale Burly, DNP-FNP [facility]  Medicine [address] [place], [place] [address] [phone]

## 2023-03-17 ENCOUNTER — Other Ambulatory Visit (HOSPITAL_COMMUNITY): Payer: Self-pay

## 2023-03-21 ENCOUNTER — Other Ambulatory Visit (HOSPITAL_COMMUNITY): Payer: Self-pay

## 2023-03-22 ENCOUNTER — Encounter: Payer: Self-pay | Admitting: Internal Medicine

## 2023-03-22 ENCOUNTER — Ambulatory Visit: Payer: BC Managed Care – PPO | Attending: Internal Medicine | Admitting: Internal Medicine

## 2023-03-22 ENCOUNTER — Other Ambulatory Visit: Payer: Self-pay | Admitting: Family Medicine

## 2023-03-22 ENCOUNTER — Other Ambulatory Visit (HOSPITAL_COMMUNITY): Payer: Self-pay

## 2023-03-22 ENCOUNTER — Other Ambulatory Visit (HOSPITAL_COMMUNITY)
Admission: RE | Admit: 2023-03-22 | Discharge: 2023-03-22 | Disposition: A | Discharge status: Home / Self Care | Payer: BC Managed Care – PPO | Source: Ambulatory Visit | Attending: Internal Medicine | Admitting: Internal Medicine

## 2023-03-22 VITALS — BP 136/70 | HR 57 | Ht 65.0 in | Wt 166.2 lb

## 2023-03-22 DIAGNOSIS — E1169 Type 2 diabetes mellitus with other specified complication: Secondary | ICD-10-CM | POA: Insufficient documentation

## 2023-03-22 DIAGNOSIS — I701 Atherosclerosis of renal artery: Secondary | ICD-10-CM | POA: Diagnosis not present

## 2023-03-22 DIAGNOSIS — E785 Hyperlipidemia, unspecified: Secondary | ICD-10-CM | POA: Insufficient documentation

## 2023-03-22 DIAGNOSIS — K219 Gastro-esophageal reflux disease without esophagitis: Secondary | ICD-10-CM

## 2023-03-22 LAB — LIPID PANEL
Cholesterol: 197 mg/dL (ref 0–200)
HDL: 59 mg/dL (ref >40)
LDL Cholesterol: 123 mg/dL — ABNORMAL HIGH (ref 0–99)
Total CHOL/HDL Ratio: 3.3 {ratio}
Triglycerides: 76 mg/dL (ref <150)
VLDL: 15 mg/dL (ref 0–40)

## 2023-03-22 MED ORDER — BEMPEDOIC ACID 180 MG PO TABS: 1.0000 | ORAL_TABLET | Freq: Every day | ORAL | 11 refills | Status: DC

## 2023-03-22 NOTE — Progress Notes (Signed)
Cardiology Office Note  Date: 03/22/2023   ID: [name], DOB 1960-09-08, MRN [id number]  PCP:  Arrie Senate, FNP  Cardiologist:  None Electrophysiologist:  None   History of Present Illness: [name] is a 62 y.o. female known to have hyperlipidemia, hypertriglyceridemia, HTN is here for follow-up visit.  Blood pressure is controlled after adjusting antihypertensive medications.  She was statin intolerant, tried simvastatin, pravastatin and atorvastatin in the past with myalgias. Did not try rosuvastatin. She was previously on Repatha but insurance stopped paying for it. She was sent to lipid clinic/Pharm.D. for initiation of Repatha. Repatha was denied, appealed and status is pending.  No issues or complaints, no chest pain, DOE, orthopnea, PND, dizziness, syncope, palpitations.  No leg swelling.  Past Medical History:  Diagnosis Date   Anxiety    Complication of anesthesia    Depression    DJD (degenerative joint disease) of knee    Elevated liver enzymes    Gout    Hot flashes    Hyperlipidemia    Hypertension    Pneumonia    PONV (postoperative nausea and vomiting)    Very per pt.   Seasonal allergies    Vitamin D deficiency     Past Surgical History:  Procedure Laterality Date   CARPAL TUNNEL RELEASE     right   CESAREAN SECTION  1990   COLONOSCOPY     KNEE ARTHROSCOPY     right x 2   TOTAL KNEE ARTHROPLASTY Right 09/24/2018   Procedure: TOTAL KNEE ARTHROPLASTY;  Surgeon: Salvatore Marvel, MD;  Location: [facility];  Service: Orthopedics;  Laterality: Right;    Current Outpatient Medications  Medication Sig Dispense Refill   albuterol (VENTOLIN HFA) 108 (90 Base) MCG/ACT inhaler Inhale into the lungs.     allopurinol (ZYLOPRIM) 100 MG tablet TAKE ONE TABLET DAILY 60 tablet 3   amLODipine (NORVASC) 10 MG tablet Take 1 tablet (10 mg total) by mouth daily. 90 tablet 3   aspirin EC 81 MG tablet Take 1 tablet (81 mg total) by mouth daily.  60 tablet 0   Blood Glucose Monitoring Suppl DEVI 1 each by Does not apply route in the morning, at noon, and at bedtime. May substitute to any manufacturer covered by patient's insurance. 1 each 0   carvedilol (COREG) 6.25 MG tablet Take 1 tablet (6.25 mg total) by mouth 2 (two) times daily. 180 tablet 3   cetirizine (ZYRTEC) 10 MG tablet TAKE 1 TABLET AT BEDTIME FOR ALLERGIES 60 tablet 5   cyclobenzaprine (FLEXERIL) 10 MG tablet TAKE ONE TABLET TWICE DAILY AS NEEDED     escitalopram (LEXAPRO) 20 MG tablet Take 1 tablet (20 mg total) by mouth daily. 90 tablet 1   fenofibrate (TRICOR) 145 MG tablet Take 1 tablet (145 mg total) by mouth daily. 30 tablet 1   fluticasone (FLONASE) 50 MCG/ACT nasal spray 2 sprays every day until directed to stop     Glucose Blood (BLOOD GLUCOSE TEST STRIPS) STRP 1 each by In Vitro route in the morning, at noon, and at bedtime. May substitute to any manufacturer covered by patient's insurance. 100 strip 0   hydrochlorothiazide (HYDRODIURIL) 25 MG tablet Take 1 tablet (25 mg total) by mouth daily. 90 tablet 3   ibuprofen (ADVIL) 800 MG tablet TAKE ONE TABLET BY MOUTH TWICE DAILY AFTER MEALS AS NEEDED FOR PAIN     icosapent Ethyl (VASCEPA) 1 g capsule Take 2 capsules by mouth twice daily with food  120 capsule 5   ipratropium (ATROVENT) 0.06 % nasal spray 2 sprays into each nostril Three (3) times a day.     Lancet Device MISC 1 each by Does not apply route in the morning, at noon, and at bedtime. May substitute to any manufacturer covered by patient's insurance. 1 each 0   Lancets Misc. MISC 1 each by Does not apply route in the morning, at noon, and at bedtime. May substitute to any manufacturer covered by patient's insurance. 100 each 0   metFORMIN (GLUCOPHAGE) 500 MG tablet Take 1 tablet (500 mg total) by mouth 2 (two) times daily. 180 tablet 0   omeprazole (PRILOSEC) 10 MG capsule TAKE 1 CAPSULE DAILY 30 capsule 4   ondansetron (ZOFRAN) 4 MG tablet Take 1 tablet (4 mg  total) by mouth every 8 (eight) hours as needed for nausea or vomiting. 20 tablet 0   spironolactone (ALDACTONE) 25 MG tablet TAKE ONE TABLET BY MOUTH DAILY 90 tablet 0   valsartan (DIOVAN) 320 MG tablet Take 1 tablet (320 mg total) by mouth daily. 90 tablet 3   No current facility-administered medications for this visit.   Allergies:  Demerol  [meperidine hcl], Sulfa antibiotics, Wound dressing adhesive, Demerol [meperidine], and Statins   Social History: The patient  reports that she quit smoking about 19 years ago. Her smoking use included cigarettes. She started smoking about 37 years ago. She has a 18 pack-year smoking history. She has never used smokeless tobacco. She reports that she does not drink alcohol and does not use drugs.   Family History: The patient's family history includes Arthritis in her father and mother; Hypertension in her father and mother.   ROS:  Please see the history of present illness. Otherwise, complete review of systems is positive for none.  All other systems are reviewed and negative.   Physical Exam: VS:  BP 136/70   Pulse (!) 57   Ht 5\' 5"  (1.651 m)   Wt 166 lb 3.2 oz (75.4 kg)   SpO2 97%   BMI 27.66 kg/m , BMI Body mass index is 27.66 kg/m.  Wt Readings from Last 3 Encounters:  03/22/23 166 lb 3.2 oz (75.4 kg)  03/14/23 165 lb 3.2 oz (74.9 kg)  12/08/22 158 lb (71.7 kg)    General: Patient appears comfortable at rest. HEENT: Conjunctiva and lids normal, oropharynx clear with moist mucosa. Neck: Supple, no elevated JVP or carotid bruits, no thyromegaly. Lungs: Clear to auscultation, nonlabored breathing at rest. Cardiac: Regular rate and rhythm, no S3 or significant systolic murmur, no pericardial rub. Abdomen: Soft, nontender, no hepatomegaly, bowel sounds present, no guarding or rebound. Extremities: No pitting edema, distal pulses 2+. Skin: Warm and dry. Musculoskeletal: No kyphosis. Neuropsychiatric: Alert and oriented x3, affect grossly  appropriate.  Recent Labwork: 08/12/2022: TSH 1.320 10/25/2022: Hemoglobin 13.0; Platelets 348 12/08/2022: ALT 15; AST 14; BUN 22; Creatinine, Ser 0.90; Potassium 3.7; Sodium 143     Component Value Date/Time   CHOL 231 (H) 08/12/2022 1222   TRIG 184 (H) 08/12/2022 1222   HDL 50 08/12/2022 1222   CHOLHDL 4.6 (H) 08/12/2022 1222   CHOLHDL 4.2 09/26/2012 1206   VLDL 46 (H) 09/26/2012 1206   LDLCALC 148 (H) 08/12/2022 1222     Assessment and Plan:  HTN, controlled: Continue current antihypertensives, amlodipine 10 mg once daily, carvedilol 6.25 mg twice daily, HCTZ 25 mg once daily, valsartan 320 mg once daily and spironolactone 25 mg once daily.  Continue to check BPs  in a.m. and p.m. at home and keep a log.  Will obtain renal artery Doppler to rule out renal artery stenosis but unlikely she has it.  HLD, hypertriglyceridemia: LDL 148 in 5/24 and TG 184 in 5/24.  Both elevated.  Statin intolerant, tried and failed 3 different statins namely atorvastatin, simvastatin and pravastatin with resultant myalgias.  She was on Repatha in the past when insurance stopped paying for it.  Sent to [facility] for Repatha but again denied, no appealed with status pending.  Start bempedoic acid 180 mg once daily in the meantime.  Obtain lipid panel today.  Continue fenofibrate for hypertriglyceridemia.  No indication for Vascepa.  OSA on CPAP: Continue CPAP.     Medication Adjustments/Labs and Tests Ordered: Current medicines are reviewed at length with the patient today.  Concerns regarding medicines are outlined above.    Disposition:  Follow up 6 months  Signed, Millette Halberstam Verne Spurr, MD, 03/22/2023 9:07 AM    [facility] [address] [place], [place] [address]

## 2023-03-22 NOTE — Addendum Note (Signed)
Addended by: Roseanne Reno on: 03/22/2023 09:53 AM   Modules accepted: Orders

## 2023-03-22 NOTE — Telephone Encounter (Signed)
Pharmacy Patient Advocate Encounter  Received notification from [facility] ADVANTAGE/RX ADVANCE that Prior Authorization for repatha has been APPROVED from 03/22/23 to 03/20/24. Ran test claim, Copay is $30.00- one month. This test claim was processed through [facility]- copay amounts may vary at other pharmacies due to pharmacy/plan contracts, or as the patient moves through the different stages of their insurance plan.   PA #/Case ID/Reference #: [id number]

## 2023-03-22 NOTE — Patient Instructions (Signed)
Medication Instructions:  Your physician has recommended you make the following change in your medication:   -Start Bempedoic Acid 180 mg once daily.   *If you need a refill on your cardiac medications before your next appointment, please call your pharmacy*   Lab Work: Lipid Panel- fasting  If you have labs (blood work) drawn today and your tests are completely normal, you will receive your results only by: MyChart Message (if you have MyChart) OR A paper copy in the mail If you have any lab test that is abnormal or we need to change your treatment, we will call you to review the results.   Testing/Procedures: None   Follow-Up: At [facility], you and your health needs are our priority.  As part of our continuing mission to provide you with exceptional heart care, we have created designated Provider Care Teams.  These Care Teams include your primary Cardiologist (physician) and Advanced Practice Providers (APPs -  Physician Assistants and Nurse Practitioners) who all work together to provide you with the care you need, when you need it.  We recommend signing up for the patient portal called "MyChart".  Sign up information is provided on this After Visit Summary.  MyChart is used to connect with patients for Virtual Visits (Telemedicine).  Patients are able to view lab/test results, encounter notes, upcoming appointments, etc.  Non-urgent messages can be sent to your provider as well.   To learn more about what you can do with MyChart, go to [email or web address].    Your next appointment:   6 month(s)  Provider:   You may see Luane School, MD or one of the following Advanced Practice Providers on your designated Care Team:   [place], PA-C  Jacolyn Reedy, [place]     Other Instructions

## 2023-03-23 MED ORDER — REPATHA SURECLICK 140 MG/ML ~~LOC~~ SOAJ: 1.0000 mL | SUBCUTANEOUS | 5 refills | Status: DC

## 2023-03-23 NOTE — Addendum Note (Signed)
Addended by: Cheree Ditto on: 03/23/2023 07:28 AM   Modules accepted: Orders

## 2023-04-03 ENCOUNTER — Other Ambulatory Visit (HOSPITAL_COMMUNITY): Payer: Self-pay

## 2023-04-03 ENCOUNTER — Telehealth: Payer: Self-pay

## 2023-04-03 NOTE — Telephone Encounter (Signed)
Pharmacy Patient Advocate Encounter  Received notification from [facility] that Prior Authorization for NEXLETOL has been APPROVED from 04/03/23 to 04/01/26. Ran test claim, Copay is $25. This test claim was processed through [facility]- copay amounts may vary at other pharmacies due to pharmacy/plan contracts, or as the patient moves through the different stages of their insurance plan.

## 2023-04-03 NOTE — Telephone Encounter (Signed)
Pharmacy Patient Advocate Encounter   Received notification from CoverMyMeds that prior authorization for NEXLETOL is required/requested.   Insurance verification completed.   The patient is insured through [facility] .   Per test claim: PA required; PA submitted to above mentioned insurance via CoverMyMeds Key/confirmation #/EOC [id number] Status is pending

## 2023-04-24 ENCOUNTER — Telehealth: Payer: Self-pay | Admitting: Internal Medicine

## 2023-04-24 NOTE — Telephone Encounter (Signed)
Pt c/o medication issue:  1. Name of Medication:   Evolocumab (REPATHA SURECLICK) 140 MG/ML SOAJ    2. How are you currently taking this medication (dosage and times per day)? As written  3. Are you having a reaction (difficulty breathing--STAT)? No   4. What is your medication issue? Pt has took two injections. Now her insurance has changed and will not approve medication

## 2023-04-25 ENCOUNTER — Telehealth: Payer: Self-pay | Admitting: Pharmacy Technician

## 2023-04-25 ENCOUNTER — Other Ambulatory Visit (HOSPITAL_COMMUNITY): Payer: Self-pay

## 2023-04-25 DIAGNOSIS — E1169 Type 2 diabetes mellitus with other specified complication: Secondary | ICD-10-CM

## 2023-04-25 DIAGNOSIS — Z789 Other specified health status: Secondary | ICD-10-CM

## 2023-04-25 DIAGNOSIS — E7849 Other hyperlipidemia: Secondary | ICD-10-CM

## 2023-04-25 MED ORDER — REPATHA SURECLICK 140 MG/ML ~~LOC~~ SOAJ: 1.0000 mL | SUBCUTANEOUS | 5 refills | Status: DC

## 2023-04-25 NOTE — Telephone Encounter (Signed)
Pharmacy Patient Advocate Encounter   Received notification from Pt Calls Messages that prior authorization for repatha is required/requested.   Insurance verification completed.   The patient is insured through [facility] ADVANTAGE/RX ADVANCE . Patient has 2 insurances   Per test claim: The current 04/25/23 day co-pay is, $30.00 one month.  No PA needed at this time. This test claim was processed through [facility]- copay amounts may vary at other pharmacies due to pharmacy/plan contracts, or as the patient moves through the different stages of their insurance plan.

## 2023-04-25 NOTE — Addendum Note (Signed)
Addended by: Cheree Ditto on: 04/25/2023 03:19 PM   Modules accepted: Orders

## 2023-04-25 NOTE — Telephone Encounter (Signed)
Pharmacy Patient Advocate Encounter  Received notification from  [employer]  that Prior Authorization for repatha has been APPROVED from 04/25/23 to 04/24/24. Ran test claim, Copay is $553.33 One month. This test claim was processed through [facility]- copay amounts may vary at other pharmacies due to pharmacy/plan contracts, or as the patient moves through the different stages of their insurance plan.  Test claim: Delorise Shiner period claim. 100% coinsurance [facility])  PA #/Case ID/Reference #: [id number] CV

## 2023-04-25 NOTE — Telephone Encounter (Signed)
Pharmacy Patient Advocate Encounter   Received notification from Pt Calls Messages that prior authorization for repatha is required/requested.   Insurance verification completed.   The patient is insured through  [employer]  .   Per test claim: PA required; PA submitted to above mentioned insurance via CoverMyMeds Key/confirmation #/EOC [id number] Status is pending

## 2023-05-16 ENCOUNTER — Other Ambulatory Visit: Payer: Self-pay | Admitting: Family Medicine

## 2023-05-16 DIAGNOSIS — E1169 Type 2 diabetes mellitus with other specified complication: Secondary | ICD-10-CM

## 2023-05-19 ENCOUNTER — Other Ambulatory Visit: Payer: Self-pay | Admitting: Family Medicine

## 2023-05-19 DIAGNOSIS — I152 Hypertension secondary to endocrine disorders: Secondary | ICD-10-CM

## 2023-06-09 ENCOUNTER — Other Ambulatory Visit: Payer: Self-pay | Admitting: Family Medicine

## 2023-06-09 DIAGNOSIS — E1169 Type 2 diabetes mellitus with other specified complication: Secondary | ICD-10-CM

## 2023-06-10 ENCOUNTER — Other Ambulatory Visit: Payer: Self-pay | Admitting: Family Medicine

## 2023-06-10 DIAGNOSIS — K219 Gastro-esophageal reflux disease without esophagitis: Secondary | ICD-10-CM

## 2023-06-13 ENCOUNTER — Encounter: Payer: Self-pay | Admitting: Family Medicine

## 2023-06-13 ENCOUNTER — Ambulatory Visit: Payer: BC Managed Care – PPO | Admitting: Family Medicine

## 2023-06-13 VITALS — BP 153/78 | HR 53 | Temp 97.8°F | Ht 65.0 in | Wt 172.0 lb

## 2023-06-13 DIAGNOSIS — F419 Anxiety disorder, unspecified: Secondary | ICD-10-CM | POA: Diagnosis not present

## 2023-06-13 DIAGNOSIS — E1159 Type 2 diabetes mellitus with other circulatory complications: Secondary | ICD-10-CM | POA: Diagnosis not present

## 2023-06-13 DIAGNOSIS — E1169 Type 2 diabetes mellitus with other specified complication: Secondary | ICD-10-CM

## 2023-06-13 DIAGNOSIS — E785 Hyperlipidemia, unspecified: Secondary | ICD-10-CM

## 2023-06-13 DIAGNOSIS — K219 Gastro-esophageal reflux disease without esophagitis: Secondary | ICD-10-CM

## 2023-06-13 DIAGNOSIS — F330 Major depressive disorder, recurrent, mild: Secondary | ICD-10-CM

## 2023-06-13 DIAGNOSIS — Z7984 Long term (current) use of oral hypoglycemic drugs: Secondary | ICD-10-CM

## 2023-06-13 DIAGNOSIS — E559 Vitamin D deficiency, unspecified: Secondary | ICD-10-CM

## 2023-06-13 DIAGNOSIS — M1A9XX0 Chronic gout, unspecified, without tophus (tophi): Secondary | ICD-10-CM

## 2023-06-13 DIAGNOSIS — I152 Hypertension secondary to endocrine disorders: Secondary | ICD-10-CM

## 2023-06-13 LAB — LIPID PANEL

## 2023-06-13 LAB — BAYER DCA HB A1C WAIVED: HB A1C (BAYER DCA - WAIVED): 4.8 % (ref 4.8–5.6)

## 2023-06-13 NOTE — Progress Notes (Signed)
 Subjective:  Patient ID: [name], female    DOB: December 11, 1960, 63 y.o.   MRN: [id number]  Patient Care Team: Arrie Senate, FNP as PCP - General (Family Medicine) Mallipeddi, Orion Modest, MD as PCP - Cardiology (Cardiology) Delora Fuel, [place] (Optometry)   Chief Complaint:  Medical Management of Chronic Issues   HPI: [name] is a 63 y.o. female presenting on 06/13/2023 for Medical Management of Chronic Issues  HPI 1. Hypertension associated with diabetes [facility]) She is established with Cardiology. States that she stopped checking her BP this week because she was "doing good" at home. She was getting 130-145s at home.  ROS Denies anxiety, fatigue, peripheral edema, changes to vision, chest pain, headaches, palpitations, sweats, SOB, PND, orthopnea Meds amlodipine, hydrochlorothiazide, coreg, spiro, valsartan  CAD risks Diabetes Mellitus, hypertension, hypercholesterolemia/hyperlipidemia  2. Hyperlipidemia associated with type 2 diabetes mellitus [facility]) Following with Cardiology. She has started Repatha. She then switched insurance and was out of medication for one month. She has since restarted it. Denies side effects.   3. Type 2 diabetes mellitus with other specified complication, without long-term current use of insulin (HCC) She is not checking frequently.  Last eye exam: due, she plans to make appt . Last foot exam: due in September  Last A1c:  Lab Results  Component Value Date   HGBA1C 5.6 12/08/2022   Nephropathy screen indicated?: due in September  Last flu, zoster and/or pneumovax:  Immunization History  Administered Date(s) Administered   Fluzone Influenza virus vaccine,trivalent (IIV3), split virus 01/25/2020   Influenza-Unspecified 01/25/2020, 01/11/2021   Moderna Sars-Covid-2 Vaccination 02/12/2020, 03/18/2020   PFIZER SARS-COV-2 Pediatric Vaccination 5-22yrs 03/26/2020   PPD Test 01/08/2020   Tdap 12/22/2009    ROS: Denies  dizziness, LOC, polyuria, polydipsia, unintended weight loss/gain, foot ulcerations, numbness or tingling in extremities, shortness of breath or chest pain.  4. Gastroesophageal reflux disease without esophagitis Current medications - prilosec  Adverse side effects - no  Sore throat - no Voice change - no Hemoptysis - no Dysphagia or dyspepsia - no Water brash - no Red Flags (weight loss, hematochezia, melena, weight loss, early satiety, fevers, odynophagia, or persistent vomiting) - no  5. Anxiety/6. Mild episode of recurrent major depressive disorder (HCC) States that things are getting better since time change. She is doing well with job. She is taking lexapro. Denies side effects. Denies desire for counseling. Denies SI.   7. Vitamin D deficiency Vitamin D deficiency, follow-up  Lab Results  Component Value Date   VD25OH 27.7 (L) 08/12/2022   VD25OH 28.3 (L) 06/12/2013   CALCIUM 10.5 (H) 12/08/2022   CALCIUM 10.6 (H) 10/25/2022   PTH 24 09/13/2022   PTH Comment 09/13/2022        Wt Readings from Last 3 Encounters:  03/22/23 166 lb 3.2 oz (75.4 kg)  03/14/23 165 lb 3.2 oz (74.9 kg)  12/08/22 158 lb (71.7 kg)    She was last seen for vitamin D deficiency 6 months ago.  Management since that visit includes taking multivitamin . She reports excellent compliance with treatment. She is not having side effects.   Symptoms: No change in energy level No numbness or tingling  No bone pain No unexplained fracture   --------------------------------------------------------------------------------------------------- 8. OSA  States that she is not using CPAP as she should due to mask aggravation. Has not followed up with sleep studies in a long time.   9. Gout  Denies flares. Still taking allopurinol. States that  when it does flare it is mainly in right great toe.   Relevant past medical, surgical, family, and social history reviewed and updated as indicated.  Allergies and  medications reviewed and updated. Data reviewed: Chart in Epic.   Past Medical History:  Diagnosis Date   Anxiety    Complication of anesthesia    Depression    DJD (degenerative joint disease) of knee    Elevated liver enzymes    Gout    Hot flashes    Hyperlipidemia    Hypertension    Pneumonia    PONV (postoperative nausea and vomiting)    Very per pt.   Seasonal allergies    Vitamin D deficiency     Past Surgical History:  Procedure Laterality Date   CARPAL TUNNEL RELEASE     right   CESAREAN SECTION  1990   COLONOSCOPY     KNEE ARTHROSCOPY     right x 2   TOTAL KNEE ARTHROPLASTY Right 09/24/2018   Procedure: TOTAL KNEE ARTHROPLASTY;  Surgeon: Salvatore Marvel, MD;  Location: [facility];  Service: Orthopedics;  Laterality: Right;    Social History   Socioeconomic History   Marital status: Married    Spouse name: Not on file   Number of children: 1   Years of education: Not on file   Highest education level: Not on file  Occupational History   Not on file  Tobacco Use   Smoking status: Former    Current packs/day: 0.00    Average packs/day: 1 Werner/day for 18.0 years (18.0 ttl pk-yrs)    Types: Cigarettes    Start date: 03/05/1986    Quit date: 03/05/2004    Years since quitting: 19.2   Smokeless tobacco: Never  Vaping Use   Vaping status: Never Used  Substance and Sexual Activity   Alcohol use: No   Drug use: No   Sexual activity: Yes  Other Topics Concern   Not on file  Social History Narrative   Not on file   Social Drivers of Health   Financial Resource Strain: Low Risk  (04/28/2022)   Received from [facility], [facility]   Overall Financial Resource Strain (CARDIA)    Difficulty of Paying Living Expenses: Not hard at all  Food Insecurity: No Food Insecurity (04/28/2022)   Received from [facility], [facility]   Hunger Vital Sign    Worried About Running Out of Food in the Last Year: Never true    Ran Out of Food in the Last Year: Never  true  Transportation Needs: No Transportation Needs (04/28/2022)   Received from [facility], [facility]   PRAPARE - Transportation    Lack of Transportation (Medical): No    Lack of Transportation (Non-Medical): No  Physical Activity: Insufficiently Active (04/28/2022)   Received from [facility], [facility]   Exercise Vital Sign    Days of Exercise per Week: 5 days    Minutes of Exercise per Session: 20 min  Stress: No Stress Concern Present (04/28/2022)   Received from [facility], [facility] - Occupational Stress Questionnaire    Feeling of Stress : Only a little  Social Connections: Socially Integrated (04/28/2022)   Received from [facility], [facility]   Social Network    How would you rate your social network (family, work, friends)?: Good participation with social networks  Intimate Partner Violence: Not At Risk (04/28/2022)   Received from [facility], [facility]  HITS    Over the last 12 months how often did your partner physically hurt you?: Never    Over the last 12 months how often did your partner insult you or talk down to you?: Never    Over the last 12 months how often did your partner threaten you with physical harm?: Never    Over the last 12 months how often did your partner scream or curse at you?: Never    Outpatient Encounter Medications as of 06/13/2023  Medication Sig   albuterol (VENTOLIN HFA) 108 (90 Base) MCG/ACT inhaler Inhale into the lungs.   allopurinol (ZYLOPRIM) 100 MG tablet TAKE ONE TABLET DAILY   aspirin EC 81 MG tablet Take 1 tablet (81 mg total) by mouth daily.   Bempedoic Acid 180 MG TABS Take 1 tablet (180 mg total) by mouth daily.   Blood Glucose Monitoring Suppl DEVI 1 each by Does not apply route in the morning, at noon, and at bedtime. May substitute to any manufacturer covered by patient's insurance.   carvedilol (COREG) 6.25 MG tablet Take 1 tablet (6.25 mg total) by mouth  2 (two) times daily.   cetirizine (ZYRTEC) 10 MG tablet TAKE 1 TABLET AT BEDTIME FOR ALLERGIES   cyclobenzaprine (FLEXERIL) 10 MG tablet TAKE ONE TABLET TWICE DAILY AS NEEDED   escitalopram (LEXAPRO) 20 MG tablet Take 1 tablet (20 mg total) by mouth daily.   Evolocumab (REPATHA SURECLICK) 140 MG/ML SOAJ Inject 140 mg into the skin every 14 (fourteen) days.   fenofibrate (TRICOR) 145 MG tablet TAKE ONE TABLET ONCE DAILY   fluticasone (FLONASE) 50 MCG/ACT nasal spray 2 sprays every day until directed to stop   ibuprofen (ADVIL) 800 MG tablet TAKE ONE TABLET BY MOUTH TWICE DAILY AFTER MEALS AS NEEDED FOR PAIN   icosapent Ethyl (VASCEPA) 1 g capsule Take 2 capsules by mouth twice daily with food   ipratropium (ATROVENT) 0.06 % nasal spray 2 sprays into each nostril Three (3) times a day.   metFORMIN (GLUCOPHAGE) 500 MG tablet Take 1 tablet (500 mg total) by mouth 2 (two) times daily.   omeprazole (PRILOSEC) 10 MG capsule TAKE 1 CAPSULE DAILY   ondansetron (ZOFRAN) 4 MG tablet Take 1 tablet (4 mg total) by mouth every 8 (eight) hours as needed for nausea or vomiting.   spironolactone (ALDACTONE) 25 MG tablet TAKE 1 TABLET DAILY   valsartan (DIOVAN) 320 MG tablet Take 1 tablet (320 mg total) by mouth daily.   amLODipine (NORVASC) 10 MG tablet Take 1 tablet (10 mg total) by mouth daily.   hydrochlorothiazide (HYDRODIURIL) 25 MG tablet Take 1 tablet (25 mg total) by mouth daily.   No facility-administered encounter medications on file as of 06/13/2023.    Allergies  Allergen Reactions   Demerol  [Meperidine Hcl] Nausea And Vomiting   Sulfa Antibiotics Other (See Comments)    Muscle and bone pain  Muscle and bone pain    Tape after surgery caused blisters   Wound Dressing Adhesive Other (See Comments)    Tape after surgery caused blisters   Demerol [Meperidine] Nausea And Vomiting   Statins     Muscle and bone pain   Review of Systems As per HPI  Objective:  BP (!) 153/78 (BP Location:  Right Arm, Patient Position: Sitting, Cuff Size: Normal)   Pulse (!) 53   Temp 97.8 F (36.6 C)   Ht 5\' 5"  (1.651 m)   BMI 27.66 kg/m    Wt Readings from Last  3 Encounters:  03/22/23 166 lb 3.2 oz (75.4 kg)  03/14/23 165 lb 3.2 oz (74.9 kg)  12/08/22 158 lb (71.7 kg)   Physical Exam Constitutional:      General: She is awake. She is not in acute distress.    Appearance: Normal appearance. She is well-developed and well-groomed. She is not ill-appearing, toxic-appearing or diaphoretic.  Cardiovascular:     Rate and Rhythm: Normal rate and regular rhythm.     Pulses: Normal pulses.          Radial pulses are 2+ on the right side and 2+ on the left side.       Posterior tibial pulses are 2+ on the right side and 2+ on the left side.     Heart sounds: Normal heart sounds. No murmur heard.    No gallop.  Pulmonary:     Effort: Pulmonary effort is normal. No respiratory distress.     Breath sounds: Normal breath sounds. No stridor. No wheezing, rhonchi or rales.  Musculoskeletal:     Cervical back: Full passive range of motion without pain and neck supple.     Right lower leg: No edema.     Left lower leg: No edema.  Skin:    General: Skin is warm.     Capillary Refill: Capillary refill takes less than 2 seconds.  Neurological:     General: No focal deficit present.     Mental Status: She is alert, oriented to person, place, and time and easily aroused. Mental status is at baseline.     GCS: GCS eye subscore is 4. GCS verbal subscore is 5. GCS motor subscore is 6.     Motor: No weakness.  Psychiatric:        Attention and Perception: Attention and perception normal.        Mood and Affect: Mood and affect normal.        Speech: Speech normal.        Behavior: Behavior normal. Behavior is cooperative.        Thought Content: Thought content normal. Thought content does not include homicidal or suicidal ideation. Thought content does not include homicidal or suicidal plan.         Cognition and Memory: Cognition and memory normal.        Judgment: Judgment normal.    Results for orders placed or performed during the hospital encounter of 03/22/23  Lipid panel   Collection Time: 03/22/23  9:48 AM  Result Value Ref Range   Cholesterol 197 0 - 200 mg/dL   Triglycerides 76 <540 mg/dL   HDL 59 >98 mg/dL   Total CHOL/HDL Ratio 3.3 RATIO   VLDL 15 0 - 40 mg/dL   LDL Cholesterol 119 (H) 0 - 99 mg/dL       14/78/2956    2:13 AM 12/08/2022    8:55 AM 10/25/2022    8:37 AM 09/13/2022    8:04 AM 08/12/2022   11:31 AM  Depression screen PHQ 2/9  Decreased Interest 0 0 0 0 0  Down, Depressed, Hopeless 0 0 0 1 0  PHQ - 2 Score 0 0 0 1 0  Altered sleeping 0 0 0 0 0  Tired, decreased energy 0 0 0 1 0  Change in appetite 0 0 0 0 0  Feeling bad or failure about yourself  0 0 0 0 0  Trouble concentrating 0 0 0 0 0  Moving slowly or fidgety/restless 0 0 0 0  0  Suicidal thoughts 0 0 0 0 0  PHQ-9 Score 0 0 0 2 0  Difficult doing work/chores Not difficult at all Not difficult at all Not difficult at all Somewhat difficult Not difficult at all       03/14/2023    9:39 AM 12/08/2022    9:04 AM 10/25/2022    8:38 AM 09/13/2022    8:04 AM  GAD 7 : Generalized Anxiety Score  Nervous, Anxious, on Edge 0 1 1 1   Control/stop worrying 0 0 0 0  Worry too much - different things 0 0 0 0  Trouble relaxing 0 0 0 0  Restless 0 0 0 0  Easily annoyed or irritable 0 0 0 0  Afraid - awful might happen 0 0 0 0  Total GAD 7 Score 0 1 1 1   Anxiety Difficulty Not difficult at all Not difficult at all Not difficult at all Not difficult at all   Pertinent labs & imaging results that were available during my care of the patient were reviewed by me and considered in my medical decision making.  Assessment & Plan:  Bronwyn was seen today for medical management of chronic issues.  Diagnoses and all orders for this visit: 1. Hypertension associated with diabetes (HCC) (Primary) BP slightly  improved at end of visit. Encouraged patient to continue to monitor her BP at home and report measurements to  - CBC with Differential/Platelet  2. Hyperlipidemia associated with type 2 diabetes mellitus (HCC) Patient to continue to follow with specialty. Will repeat labs at this time. Reviewed notes from [facility], MD 03/22/2023 - Lipid panel  3. Type 2 diabetes mellitus with other specified complication, without long-term current use of insulin (HCC) Diabetes well controlled. Reviewed A1c in office. Patient to continue to monitor at home.  - CMP14+EGFR - Bayer DCA Hb A1c Waived  4. Gastroesophageal reflux disease without esophagitis Well controlled. Continue current regimen.   5. Anxiety Denies SI. Declines counseling at this time. Safety contract established. Patient to continue current regimen.   6. Mild episode of recurrent major depressive disorder (HCC) As above.   7. Vitamin D deficiency Encouraged patient to restart supplementation. Will repeat labs at follow up. Will try to add on to current labs.   8. Chronic gout involving toe of right foot without tophus, unspecified cause Well controlled. Patient to continue current regimen.    Continue all other maintenance medications.  Follow up plan: Return in about 4 months (around 10/13/2023) for Chronic Condition Follow up.   Continue healthy lifestyle choices, including diet (rich in fruits, vegetables, and lean proteins, and low in salt and simple carbohydrates) and exercise (at least 30 minutes of moderate physical activity daily).  Written and verbal instructions provided   The above assessment and management plan was discussed with the patient. The patient verbalized understanding of and has agreed to the management plan. Patient is aware to call the clinic if they develop any new symptoms or if symptoms persist or worsen. Patient is aware when to return to the clinic for a follow-up visit. Patient educated on when it  is appropriate to go to the emergency department.   Neale Burly, DNP-FNP [facility] [address] [place], [place] [address] [phone]

## 2023-06-14 ENCOUNTER — Encounter: Payer: Self-pay | Admitting: Family Medicine

## 2023-06-14 LAB — CMP14+EGFR
ALT: 23 IU/L (ref 0–32)
AST: 21 IU/L (ref 0–40)
Albumin: 4.2 g/dL (ref 3.9–4.9)
Alkaline Phosphatase: 44 IU/L (ref 44–121)
BUN/Creatinine Ratio: 29 — ABNORMAL HIGH (ref 12–28)
BUN: 27 mg/dL (ref 8–27)
CO2: 24 mmol/L (ref 20–29)
Calcium: 10 mg/dL (ref 8.7–10.3)
Chloride: 106 mmol/L (ref 96–106)
Creatinine, Ser: 0.92 mg/dL (ref 0.57–1.00)
Globulin, Total: 2.3 g/dL (ref 1.5–4.5)
Glucose: 96 mg/dL (ref 70–99)
Potassium: 4.2 mmol/L (ref 3.5–5.2)
Sodium: 144 mmol/L (ref 134–144)
Total Protein: 6.5 g/dL (ref 6.0–8.5)
eGFR: 71 mL/min/{1.73_m2} (ref >59)

## 2023-06-14 LAB — CBC WITH DIFFERENTIAL/PLATELET
Basophils Absolute: 0.1 10*3/uL (ref 0.0–0.2)
Basos: 2 %
EOS (ABSOLUTE): 0.3 10*3/uL (ref 0.0–0.4)
Eos: 4 %
Hematocrit: 36.2 % (ref 34.0–46.6)
Hemoglobin: 11.7 g/dL (ref 11.1–15.9)
Immature Grans (Abs): 0 10*3/uL (ref 0.0–0.1)
Immature Granulocytes: 0 %
Lymphocytes Absolute: 2 10*3/uL (ref 0.7–3.1)
Lymphs: 29 %
MCH: 28.6 pg (ref 26.6–33.0)
MCHC: 32.3 g/dL (ref 31.5–35.7)
MCV: 89 fL (ref 79–97)
Monocytes Absolute: 0.5 10*3/uL (ref 0.1–0.9)
Monocytes: 8 %
Neutrophils Absolute: 3.9 10*3/uL (ref 1.4–7.0)
Neutrophils: 57 %
Platelets: 303 10*3/uL (ref 150–450)
RBC: 4.09 x10E6/uL (ref 3.77–5.28)
RDW: 13.1 % (ref 11.7–15.4)
WBC: 6.8 10*3/uL (ref 3.4–10.8)

## 2023-06-14 LAB — LIPID PANEL
Cholesterol, Total: 145 mg/dL (ref 100–199)
HDL: 54 mg/dL (ref >39)
LDL CALC COMMENT:: 2.7 ratio (ref 0.0–4.4)
LDL Chol Calc (NIH): 69 mg/dL (ref 0–99)
Triglycerides: 127 mg/dL (ref 0–149)
VLDL Cholesterol Cal: 22 mg/dL (ref 5–40)

## 2023-06-14 NOTE — Progress Notes (Signed)
 BUN/Creatinine with slight variation, not concerning at this time. Cholesterol significantly improved and LDL at goal <70 mg/dL. Remain on current medications as the 10-year ASCVD risk score (Arnett DK, et al., 2019) is: 10.6%.

## 2023-06-15 ENCOUNTER — Ambulatory Visit: Admitting: Family Medicine

## 2023-06-15 ENCOUNTER — Encounter: Payer: Self-pay | Admitting: Family Medicine

## 2023-06-15 VITALS — BP 138/65 | HR 69 | Ht 65.0 in | Wt 171.0 lb

## 2023-06-15 DIAGNOSIS — H1032 Unspecified acute conjunctivitis, left eye: Secondary | ICD-10-CM

## 2023-06-15 LAB — VITAMIN D 25 HYDROXY (VIT D DEFICIENCY, FRACTURES): Vit D, 25-Hydroxy: 25.5 ng/mL — ABNORMAL LOW (ref 30.0–100.0)

## 2023-06-15 LAB — SPECIMEN STATUS REPORT

## 2023-06-15 MED ORDER — MAXIDEX 0.1 % OP SUSP: 2.0000 [drp] | Freq: Two times a day (BID) | OPHTHALMIC | 0 refills | Status: AC

## 2023-06-15 MED ORDER — NEOMYCIN-POLYMYXIN-HC 3.5-10000-1 OP SUSP: 3.0000 [drp] | Freq: Four times a day (QID) | OPHTHALMIC | 0 refills | Status: DC

## 2023-06-15 MED ORDER — VITAMIN D (ERGOCALCIFEROL) 1.25 MG (50000 UNIT) PO CAPS: 50000.0000 [IU] | ORAL_CAPSULE | ORAL | 0 refills | Status: AC

## 2023-06-15 NOTE — Addendum Note (Signed)
 Addended by: Neale Burly on: 06/15/2023 12:45 PM   Modules accepted: Orders

## 2023-06-15 NOTE — Addendum Note (Signed)
 Addended by: Arville Care on: 06/15/2023 09:00 AM   Modules accepted: Orders

## 2023-06-15 NOTE — Progress Notes (Signed)
 Vitamin D level is low. I have sent in a weekly supplement to take for the next 12 weeks. After that, take a daily OTC vitamin D supplement with 1000-2000 IU.

## 2023-06-15 NOTE — Progress Notes (Signed)
 BP 138/65   Pulse 69   Ht 5\' 5"  (1.651 m)   Wt 171 lb (77.6 kg)   SpO2 100%   BMI 28.46 kg/m    Subjective:   Patient ID: [name], female    DOB: 23-May-1960, 63 y.o.   MRN: [id number]  HPI: [name] is a 63 y.o. female presenting on 06/15/2023 for Eye Drainage (Left. Swollen and painful)   HPI Left eye swollen and slightly painful Patient comes in today complaining of left eye pain and swelling and some drainage.  She says it started overnight last night and she woke with swelling and pain and irritation in her eye and a little bit of purulent drainage.  She says her vision is fine and she denies any visual changes.  She denies any trauma.  She has recently had some seasonal allergies and congestion going on.  She denies any fevers or chills.  She denies any pain with range of motion of the eye.  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  Relevant past medical, surgical, family and social history reviewed and updated as indicated. Interim medical history since our last visit reviewed. Allergies and medications reviewed and updated.  Review of Systems  Constitutional:  Negative for chills and fever.  HENT:  Positive for congestion and sinus pressure. Negative for ear discharge and ear pain.   Eyes:  Positive for pain, discharge and redness. Negative for photophobia and visual disturbance.  Respiratory:  Negative for chest tightness and shortness of breath.   Cardiovascular:  Negative for chest pain and leg swelling.  Genitourinary:  Negative for difficulty urinating and dysuria.  Musculoskeletal:  Negative for back pain and gait problem.  Skin:  Negative for rash.  Neurological:  Negative for light-headedness and headaches.  Psychiatric/Behavioral:  Negative for agitation and behavioral problems.   All other systems reviewed and are negative.   Per HPI unless specifically indicated  above   Allergies as of 06/15/2023       Reactions   Demerol  [meperidine Hcl] Nausea And Vomiting   Sulfa Antibiotics Other (See Comments)   Muscle and bone pain  Muscle and bone pain    Tape after surgery caused blisters   Wound Dressing Adhesive Other (See Comments)   Tape after surgery caused blisters   Demerol [meperidine] Nausea And Vomiting   Statins    Muscle and bone pain        Medication List        Accurate as of June 15, 2023  8:20 AM. If you have any questions, ask your nurse or doctor.          albuterol 108 (90 Base) MCG/ACT inhaler Commonly known as: VENTOLIN HFA Inhale into the lungs.   allopurinol 100 MG tablet Commonly known as: ZYLOPRIM TAKE ONE TABLET DAILY   amLODipine 10 MG tablet Commonly known as: NORVASC Take 1 tablet (10 mg total) by mouth daily.   aspirin EC 81 MG tablet Take 1 tablet (81 mg total) by mouth daily.   Bempedoic Acid 180 MG Tabs Take 1 tablet (180 mg total) by mouth daily.   Blood Glucose Monitoring Suppl Devi 1 each by Does not apply route in the morning, at noon, and at bedtime. May substitute to any manufacturer covered by patient's insurance.   carvedilol 6.25 MG tablet Commonly known as: COREG Take 1 tablet (6.25 mg total)  by mouth 2 (two) times daily.   cetirizine 10 MG tablet Commonly known as: ZYRTEC TAKE 1 TABLET AT BEDTIME FOR ALLERGIES   cyclobenzaprine 10 MG tablet Commonly known as: FLEXERIL TAKE ONE TABLET TWICE DAILY AS NEEDED   escitalopram 20 MG tablet Commonly known as: LEXAPRO Take 1 tablet (20 mg total) by mouth daily.   fenofibrate 145 MG tablet Commonly known as: TRICOR TAKE ONE TABLET ONCE DAILY   fluticasone 50 MCG/ACT nasal spray Commonly known as: FLONASE 2 sprays every day until directed to stop   hydrochlorothiazide 25 MG tablet Commonly known as: HYDRODIURIL Take 1 tablet (25 mg total) by mouth daily.   ibuprofen 800 MG tablet Commonly known as: ADVIL TAKE ONE  TABLET BY MOUTH TWICE DAILY AFTER MEALS AS NEEDED FOR PAIN   ipratropium 0.06 % nasal spray Commonly known as: ATROVENT 2 sprays into each nostril Three (3) times a day.   metFORMIN 500 MG tablet Commonly known as: GLUCOPHAGE Take 1 tablet (500 mg total) by mouth 2 (two) times daily.   neomycin-polymyxin-hydrocortisone 3.5-10000-1 ophthalmic suspension Commonly known as: CORTISPORIN Place 3 drops into the left eye 4 (four) times daily for 7 days. Started by: Elige Radon Nainika Newlun   omeprazole 10 MG capsule Commonly known as: PRILOSEC TAKE 1 CAPSULE DAILY   ondansetron 4 MG tablet Commonly known as: Zofran Take 1 tablet (4 mg total) by mouth every 8 (eight) hours as needed for nausea or vomiting.   Repatha SureClick 140 MG/ML Soaj Generic drug: Evolocumab Inject 140 mg into the skin every 14 (fourteen) days.   spironolactone 25 MG tablet Commonly known as: ALDACTONE TAKE 1 TABLET DAILY   valsartan 320 MG tablet Commonly known as: DIOVAN Take 1 tablet (320 mg total) by mouth daily.         Objective:   BP 138/65   Pulse 69   Ht 5\' 5"  (1.651 m)   Wt 171 lb (77.6 kg)   SpO2 100%   BMI 28.46 kg/m   Wt Readings from Last 3 Encounters:  06/15/23 171 lb (77.6 kg)  06/13/23 172 lb (78 kg)  03/22/23 166 lb 3.2 oz (75.4 kg)    Physical Exam Vitals and nursing note reviewed.  Constitutional:      General: She is not in acute distress.    Appearance: She is well-developed. She is not diaphoretic.  Eyes:     General:        Left eye: Discharge present.    Extraocular Movements: Extraocular movements intact.     Conjunctiva/sclera:     Left eye: Left conjunctiva is injected. Exudate present. No hemorrhage. Skin:    General: Skin is warm and dry.     Findings: No rash.  Neurological:     Mental Status: She is alert and oriented to person, place, and time.     Coordination: Coordination normal.  Psychiatric:        Behavior: Behavior normal.       Assessment  & Plan:   Problem List Items Addressed This Visit   None Visit Diagnoses       Acute conjunctivitis of left eye, unspecified acute conjunctivitis type    -  Primary   Relevant Medications   neomycin-polymyxin-hydrocortisone (CORTISPORIN) 3.5-10000-1 ophthalmic suspension       Will go ahead and treat with antibiotic eyedrops, does look like conjunctivitis but gave her some warning signs and what to look out for for glaucoma including vision changes or increased or severe pain  or pain with range of motion. Follow up plan: Return if symptoms worsen or fail to improve.  Counseling provided for all of the vaccine components No orders of the defined types were placed in this encounter.   Arville Care, MD [facility] 06/15/2023, 8:20 AM

## 2023-07-03 ENCOUNTER — Other Ambulatory Visit: Payer: Self-pay | Admitting: Family Medicine

## 2023-07-03 DIAGNOSIS — E1169 Type 2 diabetes mellitus with other specified complication: Secondary | ICD-10-CM

## 2023-07-03 DIAGNOSIS — E1159 Type 2 diabetes mellitus with other circulatory complications: Secondary | ICD-10-CM

## 2023-07-03 DIAGNOSIS — K219 Gastro-esophageal reflux disease without esophagitis: Secondary | ICD-10-CM

## 2023-07-07 ENCOUNTER — Telehealth: Payer: Self-pay | Admitting: Family Medicine

## 2023-08-01 ENCOUNTER — Encounter: Payer: Self-pay | Admitting: *Deleted

## 2023-08-08 ENCOUNTER — Other Ambulatory Visit: Payer: Self-pay | Admitting: Family Medicine

## 2023-09-09 ENCOUNTER — Other Ambulatory Visit: Payer: Self-pay | Admitting: Family Medicine

## 2023-09-09 DIAGNOSIS — E119 Type 2 diabetes mellitus without complications: Secondary | ICD-10-CM

## 2023-09-09 DIAGNOSIS — E1169 Type 2 diabetes mellitus with other specified complication: Secondary | ICD-10-CM

## 2023-09-29 ENCOUNTER — Ambulatory Visit: Payer: BC Managed Care – PPO | Attending: Internal Medicine | Admitting: Internal Medicine

## 2023-09-29 ENCOUNTER — Encounter: Payer: Self-pay | Admitting: Internal Medicine

## 2023-09-29 VITALS — BP 162/78 | HR 62 | Ht 66.0 in | Wt 175.0 lb

## 2023-09-29 DIAGNOSIS — I10 Essential (primary) hypertension: Secondary | ICD-10-CM | POA: Diagnosis not present

## 2023-09-29 DIAGNOSIS — R001 Bradycardia, unspecified: Secondary | ICD-10-CM | POA: Diagnosis not present

## 2023-09-29 MED ORDER — LOSARTAN POTASSIUM 50 MG PO TABS: 50.0000 mg | ORAL_TABLET | Freq: Every day | ORAL | 3 refills | Status: DC

## 2023-09-29 MED ORDER — ISOSORB DINITRATE-HYDRALAZINE 20-37.5 MG PO TABS: 1.0000 | ORAL_TABLET | Freq: Three times a day (TID) | ORAL | 3 refills | Status: AC

## 2023-09-29 NOTE — Progress Notes (Signed)
 Cardiology Office Note  Date: 09/29/2023   ID: [name], [name] [birth year], MRN [id number]  PCP:  Cathlene Marry Lenis, FNP  Cardiologist:  Diannah SHAUNNA Maywood, MD Electrophysiologist:  None   History of Present Illness: [name] is a 63 y.o. female known to have hyperlipidemia, hypertriglyceridemia, HTN is here for follow-up visit.  BP around 140 to 150 mmHg SBP at home.  Has no symptoms of angina or DOE.  No dizziness, syncope, palpitations or leg swelling.  Currently on fenofibrate  and Repatha , I reviewed her lipid panel which has been under control.  Past Medical History:  Diagnosis Date   Anxiety    Complication of anesthesia    Depression    DJD (degenerative joint disease) of knee    Elevated liver enzymes    Gout    Hot flashes    Hyperlipidemia    Hypertension    Pneumonia    PONV (postoperative nausea and vomiting)    Very per pt.   Seasonal allergies    Vitamin D  deficiency     Past Surgical History:  Procedure Laterality Date   CARPAL TUNNEL RELEASE     right   CESAREAN SECTION  1990   COLONOSCOPY     KNEE ARTHROSCOPY     right x 2   TOTAL KNEE ARTHROPLASTY Right 09/24/2018   Procedure: TOTAL KNEE ARTHROPLASTY;  Surgeon: Jane Charleston, MD;  Location: [facility];  Service: Orthopedics;  Laterality: Right;    Current Outpatient Medications  Medication Sig Dispense Refill   allopurinol  (ZYLOPRIM ) 100 MG tablet TAKE ONE TABLET DAILY 60 tablet 3   amLODipine  (NORVASC ) 10 MG tablet Take 1 tablet (10 mg total) by mouth daily. 90 tablet 3   aspirin  EC 81 MG tablet Take 1 tablet (81 mg total) by mouth daily. 60 tablet 0   Bempedoic Acid  180 MG TABS Take 1 tablet (180 mg total) by mouth daily. 30 tablet 11   Blood Glucose Monitoring Suppl DEVI 1 each by Does not apply route in the morning, at noon, and at bedtime. May substitute to any manufacturer covered by patient's insurance. 1 each 0   carvedilol  (COREG ) 6.25 MG tablet Take 1 tablet (6.25  mg total) by mouth 2 (two) times daily. 180 tablet 3   cetirizine  (ZYRTEC ) 10 MG tablet TAKE 1 TABLET AT BEDTIME FOR ALLERGIES 60 tablet 5   cyclobenzaprine (FLEXERIL) 10 MG tablet as needed.     ELDERBERRY PO Take by mouth daily. 1 tablespoon daily     escitalopram  (LEXAPRO ) 20 MG tablet Take 1 tablet (20 mg total) by mouth daily. 90 tablet 1   Evolocumab  (REPATHA  SURECLICK) 140 MG/ML SOAJ Inject 140 mg into the skin every 14 (fourteen) days. 2 mL 5   fenofibrate  (TRICOR ) 145 MG tablet TAKE ONE TABLET ONCE DAILY 30 tablet 3   fluticasone (FLONASE) 50 MCG/ACT nasal spray as needed.     hydrochlorothiazide  (HYDRODIURIL ) 25 MG tablet Take 1 tablet (25 mg total) by mouth daily. 90 tablet 3   ibuprofen (ADVIL) 800 MG tablet as needed.     metFORMIN  (GLUCOPHAGE ) 500 MG tablet TAKE ONE TABLET BY MOUTH TWICE DAILY 180 tablet 0   Multiple Vitamin (MULTIVITAMIN) tablet Take 1 tablet by mouth daily.     omeprazole  (PRILOSEC) 10 MG capsule TAKE 1 CAPSULE DAILY 30 capsule 3   ondansetron  (ZOFRAN ) 4 MG tablet Take 1 tablet (4 mg total) by mouth every 8 (eight) hours as needed for nausea or vomiting. 20  tablet 0   spironolactone  (ALDACTONE ) 25 MG tablet TAKE ONE TABLET DAILY 60 tablet 3   valsartan  (DIOVAN ) 320 MG tablet Take 1 tablet (320 mg total) by mouth daily. 90 tablet 3   No current facility-administered medications for this visit.   Allergies:  Demerol   [meperidine  hcl], Sulfa antibiotics, Wound dressing adhesive, Demerol  [meperidine ], and Statins   Social History: The patient  reports that she quit smoking about 19 years ago. Her smoking use included cigarettes. She started smoking about 37 years ago. She has a 18 pack-year smoking history. She has never used smokeless tobacco. She reports that she does not drink alcohol and does not use drugs.   Family History: The patient's family history includes Arthritis in her father and mother; Hypertension in her father and mother.   ROS:  Please see the  history of present illness. Otherwise, complete review of systems is positive for none.  All other systems are reviewed and negative.   Physical Exam: VS:  Ht 5' 6 (1.676 m)   Wt 175 lb (79.4 kg)   BMI 28.25 kg/m , BMI Body mass index is 28.25 kg/m.  Wt Readings from Last 3 Encounters:  09/29/23 175 lb (79.4 kg)  06/15/23 171 lb (77.6 kg)  06/13/23 172 lb (78 kg)    General: Patient appears comfortable at rest. HEENT: Conjunctiva and lids normal, oropharynx clear with moist mucosa. Neck: Supple, no elevated JVP or carotid bruits, no thyromegaly. Lungs: Clear to auscultation, nonlabored breathing at rest. Cardiac: Regular rate and rhythm, no S3 or significant systolic murmur, no pericardial rub. Abdomen: Soft, nontender, no hepatomegaly, bowel sounds present, no guarding or rebound. Extremities: No pitting edema, distal pulses 2+. Skin: Warm and dry. Musculoskeletal: No kyphosis. Neuropsychiatric: Alert and oriented x3, affect grossly appropriate.  Recent Labwork: 06/13/2023: ALT 23; AST 21; BUN 27; Creatinine, Ser 0.92; Hemoglobin 11.7; Platelets 303; Potassium 4.2; Sodium 144     Component Value Date/Time   CHOL 145 06/13/2023 0901   TRIG 127 06/13/2023 0901   HDL 54 06/13/2023 0901   CHOLHDL 2.7 06/13/2023 0901   CHOLHDL 3.3 03/22/2023 0948   VLDL 15 03/22/2023 0948   LDLCALC 69 06/13/2023 0901     Assessment and Plan:  HTN, poorly controlled: Home BP ranges around 140 to 150 mmHg SBP.  Start BiDil 20-37.5 mg TID.  Continue remaining antihypertensive medications, HCTZ 25 mg once daily, Coreg  625 mg twice daily, valsartan  320 mg once daily and spironolactone  25 mg once daily.  Renal Doppler has been pending for many months now.  Will reorder.  She is instructed to call the clinic if she does not hear back from anyone to schedule the kidney Doppler appointment.  Serum creatinine stable.  HLD, hypertriglyceridemia, at goal: LDL 69 and TG 127 around 3 months ago, both normal  limits.  Continue Repatha  and fenofibrate  145 mg once daily.  Statin intolerant, tried and failed 3 different statins namely atorvastatin, simvastatin and pravastatin  with resultant myalgias.  OSA on CPAP: Continue CPAP.     Medication Adjustments/Labs and Tests Ordered: Current medicines are reviewed at length with the patient today.  Concerns regarding medicines are outlined above.    Disposition:  Follow up 1 year  Signed, Bensen Chadderdon Arleta Maywood, MD, 09/29/2023 11:11 AM    [facility] [address], [place], [place] [address]

## 2023-09-29 NOTE — Patient Instructions (Addendum)
 Medication Instructions:  Your physician has recommended you make the following change in your medication:   -Start Bidil 20-37.5 mg tablet three times daily.   *If you need a refill on your cardiac medications before your next appointment, please call your pharmacy*  Lab Work: None  If you have labs (blood work) drawn today and your tests are completely normal, you will receive your results only by: MyChart Message (if you have MyChart) OR A paper copy in the mail If you have any lab test that is abnormal or we need to change your treatment, we will call you to review the results.  Testing/Procedures: Your physician has requested that you have a renal artery duplex. During this test, an ultrasound is used to evaluate blood flow to the kidneys. Allow one hour for this exam. Do not eat after midnight the day before and avoid carbonated beverages. Take your medications as you usually do.   Follow-Up: At [facility], you and your health needs are our priority.  As part of our continuing mission to provide you with exceptional heart care, our providers are all part of one team.  This team includes your primary Cardiologist (physician) and Advanced Practice Providers or APPs (Physician Assistants and Nurse Practitioners) who all work together to provide you with the care you need, when you need it.  Your next appointment:   1 year(s)  Provider:   You may see Vishnu P Mallipeddi, MD or one of the following Advanced Practice Providers on your designated Care Team:   [place], PA-C  [place], [place] Olivia Pavy, [place]     We recommend signing up for the patient portal called MyChart.  Sign up information is provided on this After Visit Summary.  MyChart is used to connect with patients for Virtual Visits (Telemedicine).  Patients are able to view lab/test results, encounter notes, upcoming appointments, etc.  Non-urgent messages can be sent to your provider as well.    To learn more about what you can do with MyChart, go to [email or web address].   Other Instructions

## 2023-10-02 ENCOUNTER — Other Ambulatory Visit: Payer: Self-pay | Admitting: Family Medicine

## 2023-10-02 DIAGNOSIS — J302 Other seasonal allergic rhinitis: Secondary | ICD-10-CM

## 2023-10-02 DIAGNOSIS — F419 Anxiety disorder, unspecified: Secondary | ICD-10-CM

## 2023-10-02 DIAGNOSIS — F330 Major depressive disorder, recurrent, mild: Secondary | ICD-10-CM

## 2023-10-13 ENCOUNTER — Encounter: Payer: Self-pay | Admitting: Family

## 2023-10-13 ENCOUNTER — Ambulatory Visit: Admitting: Family

## 2023-10-13 ENCOUNTER — Ambulatory Visit (INDEPENDENT_AMBULATORY_CARE_PROVIDER_SITE_OTHER)

## 2023-10-13 ENCOUNTER — Ambulatory Visit: Admitting: Family Medicine

## 2023-10-13 VITALS — BP 121/60 | HR 53 | Temp 98.2°F | Ht 66.0 in | Wt 178.0 lb

## 2023-10-13 DIAGNOSIS — F419 Anxiety disorder, unspecified: Secondary | ICD-10-CM | POA: Diagnosis not present

## 2023-10-13 DIAGNOSIS — Z7984 Long term (current) use of oral hypoglycemic drugs: Secondary | ICD-10-CM

## 2023-10-13 DIAGNOSIS — E119 Type 2 diabetes mellitus without complications: Secondary | ICD-10-CM

## 2023-10-13 DIAGNOSIS — K219 Gastro-esophageal reflux disease without esophagitis: Secondary | ICD-10-CM

## 2023-10-13 DIAGNOSIS — Z789 Other specified health status: Secondary | ICD-10-CM

## 2023-10-13 DIAGNOSIS — F330 Major depressive disorder, recurrent, mild: Secondary | ICD-10-CM | POA: Diagnosis not present

## 2023-10-13 DIAGNOSIS — I358 Other nonrheumatic aortic valve disorders: Secondary | ICD-10-CM

## 2023-10-13 DIAGNOSIS — Z23 Encounter for immunization: Secondary | ICD-10-CM

## 2023-10-13 DIAGNOSIS — E1169 Type 2 diabetes mellitus with other specified complication: Secondary | ICD-10-CM | POA: Diagnosis not present

## 2023-10-13 DIAGNOSIS — G4733 Obstructive sleep apnea (adult) (pediatric): Secondary | ICD-10-CM

## 2023-10-13 DIAGNOSIS — I152 Hypertension secondary to endocrine disorders: Secondary | ICD-10-CM

## 2023-10-13 DIAGNOSIS — E1159 Type 2 diabetes mellitus with other circulatory complications: Secondary | ICD-10-CM

## 2023-10-13 DIAGNOSIS — E559 Vitamin D deficiency, unspecified: Secondary | ICD-10-CM

## 2023-10-13 DIAGNOSIS — R001 Bradycardia, unspecified: Secondary | ICD-10-CM

## 2023-10-13 DIAGNOSIS — M1A9XX0 Chronic gout, unspecified, without tophus (tophi): Secondary | ICD-10-CM

## 2023-10-13 LAB — BAYER DCA HB A1C WAIVED: HB A1C (BAYER DCA - WAIVED): 5.5 % (ref 4.8–5.6)

## 2023-10-13 MED ORDER — FENOFIBRATE 145 MG PO TABS: 145.0000 mg | ORAL_TABLET | Freq: Every day | ORAL | 1 refills | Status: DC

## 2023-10-13 MED ORDER — ALLOPURINOL 300 MG PO TABS: 300.0000 mg | ORAL_TABLET | Freq: Every day | ORAL | 1 refills | Status: DC

## 2023-10-13 MED ORDER — ESCITALOPRAM OXALATE 20 MG PO TABS
20.0000 mg | ORAL_TABLET | Freq: Every day | ORAL | 3 refills | Status: AC
Start: 2023-10-13 — End: ?

## 2023-10-13 MED ORDER — OMEPRAZOLE 10 MG PO CPDR: 10.0000 mg | DELAYED_RELEASE_CAPSULE | Freq: Every day | ORAL | 3 refills | Status: DC

## 2023-10-13 MED ORDER — METFORMIN HCL 500 MG PO TABS: 500.0000 mg | ORAL_TABLET | Freq: Two times a day (BID) | ORAL | 2 refills | Status: DC

## 2023-10-13 MED ORDER — SPIRONOLACTONE 25 MG PO TABS
25.0000 mg | ORAL_TABLET | Freq: Every day | ORAL | 3 refills | Status: AC
Start: 2023-10-13 — End: ?

## 2023-10-13 NOTE — Progress Notes (Unsigned)
 Arrived on 10/13/2023 and has given verbal consent to obtain images and complete their overdue diabetic retinal screening.  The images have been sent to an ophthalmologist or optometrist for review and interpretation.  Results will be sent back to [facility] for review.  Patient has been informed they will be contacted when we receive the results via telephone or MyChart.  Patient says she does normally go to [facility] in [place] but is due for an appt with them. Patient denies having any noticeable issues with her eyes.

## 2023-10-13 NOTE — Addendum Note (Signed)
 Addended by: LAVELL LYE A on: 10/13/2023 09:22 AM   Modules accepted: Level of Service

## 2023-10-13 NOTE — Patient Instructions (Signed)

## 2023-10-13 NOTE — Progress Notes (Signed)
 Subjective:    Patient ID: [name], female    DOB: 1960/05/13, 63 y.o.   MRN: [id number]  Chief Complaint  Patient presents with   Medical Management of Chronic Issues    4 month follow up [facility] pt   PT presents to the office today for chronic follow up.   She is followed by Cardiologist annually for aortic valve sclerosis and sinus bradycardia.   She is followed by Ortho as needed chronic back pain and osteoarthritis.   She has OSA and uses a CPAP sometimes.   She has gout and takes allopurinol  100 mg. Reports she has mild toe pain on and off.  Hypertension This is a chronic problem. The current episode started more than 1 year ago. The problem has been resolved since onset. The problem is controlled. Associated symptoms include anxiety. Pertinent negatives include no blurred vision, malaise/fatigue, peripheral edema or shortness of breath. Risk factors for coronary artery disease include dyslipidemia and diabetes mellitus. The current treatment provides moderate improvement.  Diabetes She presents for her follow-up diabetic visit. She has type 2 diabetes mellitus. Hypoglycemia symptoms include nervousness/anxiousness. Pertinent negatives for diabetes include no blurred vision and no foot paresthesias. Risk factors for coronary artery disease include dyslipidemia, diabetes mellitus, hypertension, sedentary lifestyle and post-menopausal. She is following a generally healthy diet. (Does not check her glucose at home) Eye exam is current.  Gastroesophageal Reflux She complains of belching and heartburn. This is a chronic problem. The current episode started more than 1 year ago. The problem occurs occasionally. The symptoms are aggravated by medications. Risk factors include obesity. She has tried a PPI for the symptoms. The treatment provided moderate relief.  Hyperlipidemia This is a chronic problem. The current episode started more than 1 year ago. The problem is  controlled. Recent lipid tests were reviewed and are normal. Exacerbating diseases include obesity. Pertinent negatives include no shortness of breath. Treatments tried: rephatha. The current treatment provides moderate improvement of lipids. Risk factors for coronary artery disease include dyslipidemia, diabetes mellitus, hypertension and a sedentary lifestyle.  Arthritis Presents for follow-up visit. She complains of pain and stiffness. Affected locations include the left hip (back). Her pain is at a severity of 8/10.  Anxiety Presents for follow-up visit. Symptoms include excessive worry and nervous/anxious behavior. Patient reports no shortness of breath. Symptoms occur occasionally. The severity of symptoms is mild.    Depression        This is a chronic problem.  The current episode started more than 1 year ago.   The problem occurs intermittently.  Associated symptoms include sad.  Associated symptoms include no helplessness and no hopelessness.  Past treatments include SSRIs - Selective serotonin reuptake inhibitors.  Past medical history includes anxiety.       Review of Systems  Constitutional:  Negative for malaise/fatigue.  Eyes:  Negative for blurred vision.  Respiratory:  Negative for shortness of breath.   Gastrointestinal:  Positive for heartburn.  Musculoskeletal:  Positive for arthritis and stiffness.  Psychiatric/Behavioral:  Positive for depression. The patient is nervous/anxious.   All other systems reviewed and are negative.   Social History   Socioeconomic History   Marital status: Married    Spouse name: Not on file   Number of children: 1   Years of education: Not on file   Highest education level: Associate degree: academic program  Occupational History   Not on file  Tobacco Use   Smoking status: Former  Current packs/day: 0.00    Average packs/day: 1 pack/day for 18.0 years (18.0 ttl pk-yrs)    Types: Cigarettes    Start date: 03/05/1986    Quit  date: 03/05/2004    Years since quitting: 19.6   Smokeless tobacco: Never  Vaping Use   Vaping status: Never Used  Substance and Sexual Activity   Alcohol use: No   Drug use: No   Sexual activity: Yes  Other Topics Concern   Not on file  Social History Narrative   Not on file   Social Drivers of Health   Financial Resource Strain: Low Risk  (10/12/2023)   Overall Financial Resource Strain (CARDIA)    Difficulty of Paying Living Expenses: Not very hard  Food Insecurity: No Food Insecurity (10/12/2023)   Hunger Vital Sign    Worried About Running Out of Food in the Last Year: Never true    Ran Out of Food in the Last Year: Never true  Transportation Needs: No Transportation Needs (10/12/2023)   PRAPARE - Administrator, Civil Service (Medical): No    Lack of Transportation (Non-Medical): No  Physical Activity: Sufficiently Active (10/12/2023)   Exercise Vital Sign    Days of Exercise per Week: 4 days    Minutes of Exercise per Session: 60 min  Stress: No Stress Concern Present (10/12/2023)   [employer] of Occupational Health - Occupational Stress Questionnaire    Feeling of Stress: Not at all  Social Connections: Socially Integrated (10/12/2023)   Social Connection and Isolation Panel    Frequency of Communication with Friends and Family: More than three times a week    Frequency of Social Gatherings with Friends and Family: Once a week    Attends Religious Services: More than 4 times per year    Active Member of [employer] or Organizations: Yes    Attends Engineer, structural: More than 4 times per year    Marital Status: Married   Family History  Problem Relation Age of Onset   Hypertension Mother    Arthritis Mother    Hypertension Father    Arthritis Father    Colon cancer Neg Hx    Stomach cancer Neg Hx    BRCA 1/2 Neg Hx         Objective:   Physical Exam Vitals reviewed.  Constitutional:      General: She is not in acute distress.     Appearance: She is well-developed.  HENT:     Head: Normocephalic and atraumatic.     Right Ear: Tympanic membrane normal.     Left Ear: Tympanic membrane normal.  Eyes:     Pupils: Pupils are equal, round, and reactive to light.  Neck:     Thyroid : No thyromegaly.  Cardiovascular:     Rate and Rhythm: Normal rate and regular rhythm.     Heart sounds: Normal heart sounds. No murmur heard. Pulmonary:     Effort: Pulmonary effort is normal. No respiratory distress.     Breath sounds: Normal breath sounds. No wheezing.  Abdominal:     General: Bowel sounds are normal. There is no distension.     Palpations: Abdomen is soft.     Tenderness: There is no abdominal tenderness.  Musculoskeletal:        General: No tenderness. Normal range of motion.     Cervical back: Normal range of motion and neck supple.  Skin:    General: Skin is warm and dry.  Neurological:     Mental Status: She is alert and oriented to person, place, and time.     Cranial Nerves: No cranial nerve deficit.     Deep Tendon Reflexes: Reflexes are normal and symmetric.  Psychiatric:        Behavior: Behavior normal.        Thought Content: Thought content normal.        Judgment: Judgment normal.       BP 121/60   Pulse (!) 53   Temp 98.2 F (36.8 C)   Ht 5' 6 (1.676 m)   Wt 178 lb (80.7 kg)   SpO2 96%   BMI 28.73 kg/m      Assessment & Plan:  [name] comes in today with chief complaint of Medical Management of Chronic Issues (4 month follow up/[facility] pt)   Diagnosis and orders addressed:  1. Mild episode of recurrent major depressive disorder (HCC) - escitalopram  (LEXAPRO ) 20 MG tablet; Take 1 tablet (20 mg total) by mouth daily.  Dispense: 90 tablet; Refill: 3 - CMP14+EGFR  2. Anxiety - escitalopram  (LEXAPRO ) 20 MG tablet; Take 1 tablet (20 mg total) by mouth daily.  Dispense: 90 tablet; Refill: 3 - CMP14+EGFR  3. Hyperlipidemia associated with type 2 diabetes mellitus  (HCC)  - fenofibrate  (TRICOR ) 145 MG tablet; Take 1 tablet (145 mg total) by mouth daily.  Dispense: 90 tablet; Refill: 1 - CMP14+EGFR  4. Type 2 diabetes mellitus with other specified complication, without long-term current use of insulin (HCC) (Primary) - metFORMIN  (GLUCOPHAGE ) 500 MG tablet; Take 1 tablet (500 mg total) by mouth 2 (two) times daily.  Dispense: 180 tablet; Refill: 2 - Bayer DCA Hb A1c Waived - CMP14+EGFR  5. Diabetes mellitus treated with oral medication (HCC) - metFORMIN  (GLUCOPHAGE ) 500 MG tablet; Take 1 tablet (500 mg total) by mouth 2 (two) times daily.  Dispense: 180 tablet; Refill: 2 - CMP14+EGFR  6. Gastroesophageal reflux disease, unspecified whether esophagitis present - omeprazole  (PRILOSEC) 10 MG capsule; Take 1 capsule (10 mg total) by mouth daily.  Dispense: 90 capsule; Refill: 3 - CMP14+EGFR  7. Hypertension associated with diabetes (HCC) - spironolactone  (ALDACTONE ) 25 MG tablet; Take 1 tablet (25 mg total) by mouth daily.  Dispense: 180 tablet; Refill: 3 - CMP14+EGFR  8. Chronic gout involving toe of right foot without tophus, unspecified cause  - CMP14+EGFR - allopurinol  (ZYLOPRIM ) 300 MG tablet; Take 1 tablet (300 mg total) by mouth daily.  Dispense: 90 tablet; Refill: 1  9. OSA on CPAP - CMP14+EGFR  10. Vitamin D  deficiency - CMP14+EGFR  11. Gastroesophageal reflux disease without esophagitis  - CMP14+EGFR  12. Statin intolerance - CMP14+EGFR  13. Aortic valve sclerosis - CMP14+EGFR  14. Sinus bradycardia on ECG - CMP14+EGFR   Labs pending Will increase allopurinol  to 300 mg from 100 mg Continue current medications  Keep follow up with specialists  Health Maintenance reviewed Diet and exercise encouraged  Return in about 6 months (around 04/14/2024), or if symptoms worsen or fail to improve. Will scheduled as CPE/PAP    Bari Learn, FNP

## 2023-10-14 LAB — CMP14+EGFR
ALT: 27 IU/L (ref 0–32)
AST: 26 IU/L (ref 0–40)
Albumin: 4.3 g/dL (ref 3.9–4.9)
Alkaline Phosphatase: 41 IU/L — ABNORMAL LOW (ref 44–121)
BUN/Creatinine Ratio: 24 (ref 12–28)
BUN: 26 mg/dL (ref 8–27)
Bilirubin Total: 0.3 mg/dL (ref 0.0–1.2)
CO2: 20 mmol/L (ref 20–29)
Calcium: 9.8 mg/dL (ref 8.7–10.3)
Chloride: 104 mmol/L (ref 96–106)
Creatinine, Ser: 1.07 mg/dL — ABNORMAL HIGH (ref 0.57–1.00)
Globulin, Total: 2.4 g/dL (ref 1.5–4.5)
Glucose: 95 mg/dL (ref 70–99)
Potassium: 4.7 mmol/L (ref 3.5–5.2)
Sodium: 141 mmol/L (ref 134–144)
Total Protein: 6.7 g/dL (ref 6.0–8.5)
eGFR: 59 mL/min/1.73 — ABNORMAL LOW (ref >59)

## 2023-10-16 ENCOUNTER — Ambulatory Visit (HOSPITAL_COMMUNITY)
Admission: RE | Admit: 2023-10-16 | Discharge: 2023-10-16 | Disposition: A | Discharge status: Home / Self Care | Source: Ambulatory Visit | Attending: Surgery | Admitting: Surgery

## 2023-10-16 ENCOUNTER — Ambulatory Visit: Payer: Self-pay | Admitting: Family

## 2023-10-16 DIAGNOSIS — I10 Essential (primary) hypertension: Secondary | ICD-10-CM | POA: Diagnosis not present

## 2023-10-18 ENCOUNTER — Ambulatory Visit: Payer: Self-pay | Admitting: Internal Medicine

## 2023-10-31 ENCOUNTER — Other Ambulatory Visit: Payer: Self-pay | Admitting: Nurse Practitioner

## 2023-10-31 DIAGNOSIS — Z1231 Encounter for screening mammogram for malignant neoplasm of breast: Secondary | ICD-10-CM

## 2023-11-01 ENCOUNTER — Ambulatory Visit
Admission: RE | Admit: 2023-11-01 | Discharge: 2023-11-01 | Disposition: A | Discharge status: Home / Self Care | Source: Ambulatory Visit | Attending: Family | Admitting: Family

## 2023-11-01 DIAGNOSIS — Z1231 Encounter for screening mammogram for malignant neoplasm of breast: Secondary | ICD-10-CM

## 2023-11-28 ENCOUNTER — Other Ambulatory Visit: Payer: Self-pay | Admitting: Internal Medicine

## 2023-12-15 ENCOUNTER — Ambulatory Visit: Admitting: Family

## 2023-12-15 ENCOUNTER — Encounter: Payer: Self-pay | Admitting: Family

## 2023-12-15 VITALS — BP 155/75 | HR 62 | Temp 97.3°F | Ht 66.0 in | Wt 179.2 lb

## 2023-12-15 DIAGNOSIS — J0100 Acute maxillary sinusitis, unspecified: Secondary | ICD-10-CM

## 2023-12-15 MED ORDER — AMOXICILLIN-POT CLAVULANATE 875-125 MG PO TABS: 1.0000 | ORAL_TABLET | Freq: Two times a day (BID) | ORAL | 0 refills | Status: DC

## 2023-12-15 NOTE — Patient Instructions (Signed)

## 2023-12-15 NOTE — Progress Notes (Signed)
 Subjective:    Patient ID: [name], female    DOB: 10/10/1960, 63 y.o.   MRN: [id number]  Chief Complaint  Patient presents with   Sinus Problem   PT presents to the office today with sinus congestion and pain that has been on going for 12 days. Has been using OTC with mild improvement, but over the last few days she feels worse.  Sinus Problem This is a new problem. The current episode started 1 to 4 weeks ago. The problem has been gradually worsening since onset. There has been no fever. Her pain is at a severity of 9/10. The pain is moderate. Associated symptoms include congestion, ear pain (right), headaches, sinus pressure, sneezing and a sore throat. Pertinent negatives include no coughing. Past treatments include acetaminophen , oral decongestants and nasal decongestants. The treatment provided mild relief.      Review of Systems  HENT:  Positive for congestion, ear pain (right), sinus pressure, sneezing and sore throat.   Respiratory:  Negative for cough.   Neurological:  Positive for headaches.  All other systems reviewed and are negative.   Social History   Socioeconomic History   Marital status: Married    Spouse name: Not on file   Number of children: 1   Years of education: Not on file   Highest education level: Associate degree: academic program  Occupational History   Not on file  Tobacco Use   Smoking status: Former    Current packs/day: 0.00    Average packs/day: 1 pack/day for 18.0 years (18.0 ttl pk-yrs)    Types: Cigarettes    Start date: 03/05/1986    Quit date: 03/05/2004    Years since quitting: 19.7   Smokeless tobacco: Never  Vaping Use   Vaping status: Never Used  Substance and Sexual Activity   Alcohol use: No   Drug use: No   Sexual activity: Yes  Other Topics Concern   Not on file  Social History Narrative   Not on file   Social Drivers of Health   Financial Resource Strain: Low Risk  (10/12/2023)   Overall Financial  Resource Strain (CARDIA)    Difficulty of Paying Living Expenses: Not very hard  Food Insecurity: No Food Insecurity (10/12/2023)   Hunger Vital Sign    Worried About Running Out of Food in the Last Year: Never true    Ran Out of Food in the Last Year: Never true  Transportation Needs: No Transportation Needs (10/12/2023)   PRAPARE - Administrator, Civil Service (Medical): No    Lack of Transportation (Non-Medical): No  Physical Activity: Sufficiently Active (10/12/2023)   Exercise Vital Sign    Days of Exercise per Week: 4 days    Minutes of Exercise per Session: 60 min  Stress: No Stress Concern Present (10/12/2023)   [employer] of Occupational Health - Occupational Stress Questionnaire    Feeling of Stress: Not at all  Social Connections: Socially Integrated (10/12/2023)   Social Connection and Isolation Panel    Frequency of Communication with Friends and Family: More than three times a week    Frequency of Social Gatherings with Friends and Family: Once a week    Attends Religious Services: More than 4 times per year    Active Member of [employer] or Organizations: Yes    Attends Engineer, structural: More than 4 times per year    Marital Status: Married   Family History  Problem Relation Age of  Onset   Hypertension Mother    Arthritis Mother    Hypertension Father    Arthritis Father    Colon cancer Neg Hx    Stomach cancer Neg Hx    BRCA 1/2 Neg Hx         Objective:   Physical Exam Vitals reviewed.  Constitutional:      General: She is not in acute distress.    Appearance: She is well-developed.  HENT:     Head: Normocephalic and atraumatic.     Right Ear: Tympanic membrane normal.     Left Ear: Tympanic membrane normal.     Nose: Congestion present.     Right Sinus: Maxillary sinus tenderness and frontal sinus tenderness present.     Left Sinus: Maxillary sinus tenderness and frontal sinus tenderness present.     Mouth/Throat:      Pharynx: Pharyngeal swelling present.  Eyes:     Pupils: Pupils are equal, round, and reactive to light.  Neck:     Thyroid : No thyromegaly.  Cardiovascular:     Rate and Rhythm: Normal rate and regular rhythm.     Heart sounds: Normal heart sounds. No murmur heard. Pulmonary:     Effort: Pulmonary effort is normal. No respiratory distress.     Breath sounds: Normal breath sounds. No wheezing.  Abdominal:     General: Bowel sounds are normal. There is no distension.     Palpations: Abdomen is soft.     Tenderness: There is no abdominal tenderness.  Musculoskeletal:        General: No tenderness. Normal range of motion.     Cervical back: Normal range of motion and neck supple.  Skin:    General: Skin is warm and dry.  Neurological:     Mental Status: She is alert and oriented to person, place, and time.     Cranial Nerves: No cranial nerve deficit.     Deep Tendon Reflexes: Reflexes are normal and symmetric.  Psychiatric:        Behavior: Behavior normal.        Thought Content: Thought content normal.        Judgment: Judgment normal.       BP (!) 155/75   Pulse 62   Temp (!) 97.3 F (36.3 C)   Ht 5' 6 (1.676 m)   Wt 179 lb 3.2 oz (81.3 kg)   SpO2 97%   BMI 28.92 kg/m      Assessment & Plan:  [name] comes in today with chief complaint of Sinus Problem   Diagnosis and orders addressed:  1. Acute non-recurrent maxillary sinusitis (Primary) - Take meds as prescribed - Use a cool mist humidifier  -Use saline nose sprays frequently -Force fluids -For any cough or congestion  Use plain Mucinex - regular strength or max strength is fine -For fever or aces or pains- take tylenol  or ibuprofen. -Throat lozenges if help -Follow up if symptoms worsen or do not improve  - amoxicillin -clavulanate (AUGMENTIN ) 875-125 MG tablet; Take 1 tablet by mouth 2 (two) times daily.  Dispense: 14 tablet; Refill: 0    Bari Learn, FNP

## 2024-04-01 ENCOUNTER — Other Ambulatory Visit: Payer: Self-pay | Admitting: Family Medicine

## 2024-04-01 ENCOUNTER — Telehealth: Payer: Self-pay | Admitting: Pharmacy Technician

## 2024-04-01 ENCOUNTER — Other Ambulatory Visit (HOSPITAL_COMMUNITY): Payer: Self-pay

## 2024-04-01 NOTE — Telephone Encounter (Signed)
 Pharmacy Patient Advocate Encounter   Received notification from Onbase that prior authorization for REPATHA  is required/requested.   Insurance verification completed.   The patient is insured through [facility].   Per test claim: PA required; PA submitted to above mentioned insurance via Latent Key/confirmation #/EOC [id number] Status is pending    Pharmacy Patient Advocate Encounter  Received notification from [facility] that Prior Authorization for repatha  has been APPROVED from 04/01/24 to 04/01/25   PA #/Case ID/Reference #: [id number]

## 2024-04-12 ENCOUNTER — Ambulatory Visit: Payer: Self-pay

## 2024-04-12 NOTE — Telephone Encounter (Signed)
 FYI Only or Action Required?: Action required by provider: medication refill request.  Patient was last seen in primary care on 12/15/2023 by Lavell Bari LABOR, FNP.  Called Nurse Triage reporting Foot Pain.  Symptoms began several days ago.  Interventions attempted: OTC medications: Acetaminophen .  Symptoms are: unchanged.  Triage Disposition: See Physician Within 24 Hours  Patient/caregiver understands and will follow disposition?: Yes  Reason for Disposition  Foot or toe pain  Answer Assessment - Initial Assessment Questions Patient is calling in to request Allopurinol  prescription. She states that the refill request was denied and she has been experiencing gout pain since Tuesday. She has been taking Acetaminophen  with some relief. Office visit already scheduled for Monday 04/15/24.  1. SYMPTOM: What's the main symptom you're concerned about? (e.g., rash, sore, callus, drainage, numbness)     Pain  2. LOCATION: Where is the  pain located? (e.g., foot/toe, top/bottom, left/right)     Right big toe, bilateral ankles  3. APPEARANCE: What does the area look like? (e.g., normal, red, swollen; size)     Normal  4. ONSET: When did the  pain  start?     Tuesday  5. PAIN: Is there any pain? If Yes, ask: How bad is it? (Scale: 0-10; none, mild, moderate, severe)     Yes 5/10  6. CAUSE: What do you think is causing the symptoms?     Gout  7. BLOOD GLUCOSE: What is your blood glucose level?      Unknown  8. USUAL RANGE: What is your blood glucose level usually? (e.g., usual fasting morning value, usual evening value)     Unknown  9. OTHER SYMPTOMS: Do you have any other symptoms? (e.g., fever, weakness)     Denies any other symptoms  10. PREGNANCY: Is there any chance you are pregnant? When was your last menstrual period?       NA  Protocols used: Diabetes - Foot Problems and Questions-A-AH  Copied from CRM #[id number]. Topic: Clinical - Red Word  Triage >> Apr 12, 2024  4:31 PM Graeme ORN wrote: Red Word that prompted transfer to Nurse Triage: Pain ankle and big toe. Out of allopurinol  (ZYLOPRIM ) 300 MG tablet since Tuesday - refill request denied. Already has appt for Monday.    ----------------------------------------------------------------------- From previous Reason for Contact - Scheduling: Patient/patient representative is calling to schedule an appointment. Refer to attachments for appointment information.

## 2024-04-13 ENCOUNTER — Other Ambulatory Visit: Payer: Self-pay | Admitting: Family

## 2024-04-15 ENCOUNTER — Other Ambulatory Visit (HOSPITAL_COMMUNITY)
Admission: RE | Admit: 2024-04-15 | Discharge: 2024-04-15 | Disposition: A | Discharge status: Home / Self Care | Source: Ambulatory Visit | Attending: Family | Admitting: Family

## 2024-04-15 ENCOUNTER — Ambulatory Visit: Payer: Self-pay | Admitting: Family

## 2024-04-15 ENCOUNTER — Encounter: Payer: Self-pay | Admitting: Family

## 2024-04-15 VITALS — BP 155/66 | HR 54 | Temp 97.8°F | Ht 66.0 in | Wt 180.6 lb

## 2024-04-15 DIAGNOSIS — Z1211 Encounter for screening for malignant neoplasm of colon: Secondary | ICD-10-CM

## 2024-04-15 DIAGNOSIS — E7849 Other hyperlipidemia: Secondary | ICD-10-CM

## 2024-04-15 DIAGNOSIS — Z0000 Encounter for general adult medical examination without abnormal findings: Secondary | ICD-10-CM | POA: Insufficient documentation

## 2024-04-15 DIAGNOSIS — E785 Hyperlipidemia, unspecified: Secondary | ICD-10-CM

## 2024-04-15 DIAGNOSIS — Z789 Other specified health status: Secondary | ICD-10-CM

## 2024-04-15 DIAGNOSIS — I358 Other nonrheumatic aortic valve disorders: Secondary | ICD-10-CM

## 2024-04-15 DIAGNOSIS — Z01411 Encounter for gynecological examination (general) (routine) with abnormal findings: Secondary | ICD-10-CM | POA: Diagnosis not present

## 2024-04-15 DIAGNOSIS — G4733 Obstructive sleep apnea (adult) (pediatric): Secondary | ICD-10-CM

## 2024-04-15 DIAGNOSIS — K219 Gastro-esophageal reflux disease without esophagitis: Secondary | ICD-10-CM | POA: Diagnosis not present

## 2024-04-15 DIAGNOSIS — E559 Vitamin D deficiency, unspecified: Secondary | ICD-10-CM

## 2024-04-15 DIAGNOSIS — F330 Major depressive disorder, recurrent, mild: Secondary | ICD-10-CM | POA: Diagnosis not present

## 2024-04-15 DIAGNOSIS — F419 Anxiety disorder, unspecified: Secondary | ICD-10-CM | POA: Diagnosis not present

## 2024-04-15 DIAGNOSIS — E1169 Type 2 diabetes mellitus with other specified complication: Secondary | ICD-10-CM

## 2024-04-15 DIAGNOSIS — Z7984 Long term (current) use of oral hypoglycemic drugs: Secondary | ICD-10-CM | POA: Diagnosis not present

## 2024-04-15 DIAGNOSIS — M1A9XX0 Chronic gout, unspecified, without tophus (tophi): Secondary | ICD-10-CM | POA: Diagnosis not present

## 2024-04-15 DIAGNOSIS — Z01419 Encounter for gynecological examination (general) (routine) without abnormal findings: Secondary | ICD-10-CM

## 2024-04-15 DIAGNOSIS — Z114 Encounter for screening for human immunodeficiency virus [HIV]: Secondary | ICD-10-CM

## 2024-04-15 LAB — BAYER DCA HB A1C WAIVED: HB A1C (BAYER DCA - WAIVED): 5.5 % (ref 4.8–5.6)

## 2024-04-15 MED ORDER — FENOFIBRATE 145 MG PO TABS: 145.0000 mg | ORAL_TABLET | Freq: Every day | ORAL | 1 refills | Status: AC

## 2024-04-15 MED ORDER — OMEPRAZOLE 20 MG PO CPDR: 20.0000 mg | DELAYED_RELEASE_CAPSULE | Freq: Every day | ORAL | 1 refills | Status: AC

## 2024-04-15 MED ORDER — REPATHA SURECLICK 140 MG/ML ~~LOC~~ SOAJ: 1.0000 mL | SUBCUTANEOUS | 5 refills | Status: DC

## 2024-04-15 MED ORDER — ALLOPURINOL 300 MG PO TABS: 300.0000 mg | ORAL_TABLET | Freq: Every day | ORAL | 1 refills | Status: AC

## 2024-04-15 MED ORDER — METFORMIN HCL 500 MG PO TABS: 500.0000 mg | ORAL_TABLET | Freq: Two times a day (BID) | ORAL | 2 refills | Status: AC

## 2024-04-15 NOTE — Progress Notes (Unsigned)
 " Cardiology Office Note:  .   Date:  04/17/2024  ID:  ELEANOR GATLIFF, DOB 1960/09/19, MRN [id number] PCP: Lavell Bari LABOR, FNP  [facility] Providers Cardiologist:  Diannah SHAUNNA Maywood, MD {  History of Present Illness: Amy   KIMBERLI Werner is a 64 y.o. female  with PMHx of HLD, hypertriglyceridemia, statin intolerant, HTN, OSA on CPAP who reports to [facility] office for follow up.   Pertinent cardiac medical history:  Echo 09/2018: EF 60 to 65%, diastolic parameters C/W impaired relaxation, mildly dilated LA, mildly thickened/calcification of AV without AS Vas US  renal 10/2023: No evidence of renal artery stenosis with normal-sized kidneys  Last seen in heartcare 09/2023 by Dr. Mallipeddi for follow-up.  Denied any cardiac complaints.  Significantly elevated BP in office 162/78 with reportedly home SBP 140 to 150 mmHg.  Started BiDil 20-37.5 mg 3 times daily.  Continued on HCTZ 25 mg daily, Coreg  6.25 mg twice daily, valsartan  320 mg daily, spironolactone  25 mg daily, Repatha , fenofibrate  145 mg daily.  Per med list, also on amlodipine  10 mg daily and ASA 81 mg daily.  Ordered follow-up vascular US  renal as above.  Today, she reports overall doing well. Since starting BiDil, she has not noticed a significant change in blood pressure. Home readings have been variable, typically ranging from SBP 120-140s, though readings tend to be higher in the office (150-160s). Blood pressure at todays visit is improved compared to her last office visit. She denies chest pain, shortness of breath, palpitations, syncope, presyncope, dizziness, orthopnea, PND, edema, bleeding concerns, or claudication. She reports good medication compliance, though she has been off Repatha  for the past month due to insurance issues; this has now been reapproved and will be restarted.  She reports eating mostly home-cooked meals and fruit, though she does consume processed snack foods. 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 She is compliant with CPAP therapy. Denies tobacco, alcohol, or illicit drug use. No recent hospitalizations or ED visits.  ROS: 10 point review of system has been reviewed and considered negative except ones been listed in the HPI.   Studies Reviewed: Amy    ECHO 09/2018 IMPRESSIONS   1. The left ventricle has normal systolic function with an ejection  fraction of 60-65%. The cavity size was normal. Left ventricular diastolic  Doppler parameters are consistent with impaired relaxation.   2. The right ventricle has normal systolic function. The cavity was  normal. There is no increase in right ventricular wall thickness.   3. Left atrial size was mildly dilated.   4. The aortic valve is tricuspid. Mild thickening of the aortic valve.  Mild calcification of the aortic valve.  CV Studies: Cardiac studies reviewed are outlined and summarized above. Otherwise please see EMR for full report.   Physical Exam:   VS:  BP 138/72   Pulse 64   Ht 5' 6 (1.676 m)   Wt 182 lb 3.2 oz (82.6 kg)   SpO2 98%   BMI 29.41 kg/m    Wt Readings from Last 3 Encounters:  04/17/24 182 lb 3.2 oz (82.6 kg)  04/15/24 180 lb 9.6 oz (81.9 kg)  12/15/23 179 lb 3.2 oz (81.3 kg)    GEN: Well nourished, well developed in no acute distress while sitting in chair.  NECK: No JVD; No carotid bruits CARDIAC: RRR, no murmurs, rubs, gallops RESPIRATORY:  Clear to auscultation without  rales, wheezing or rhonchi  ABDOMEN: Soft, non-tender, non-distended EXTREMITIES:  No edema; No deformity   ASSESSMENT AND PLAN: .    Hypertension Reviewed EKG: SB, HR 55 with no acute ischemic changes.  More controlled Home BP: SBP 120-140's with higher reading in medical office (150-160s).  BP well controlled today compared to previous visit: BP 138/72  Renal artery stenosis ruled out. Recent labs:  potassium WNL, creatinine borderline elevated at 1.04 (04/2024). Continue BiDil 20/37.5 mg TID, valsartan  320 mg daily, hydrochlorothiazide  25 mg daily, carvedilol  6.25 mg BID, spironolactone  25 mg daily, and amlodipine  10 mg daily. Would avoid increasing beta-blocker at this time due to resting HR in the 60s. Discussed proper home BP measurement technique and advised keeping a BP log. If BP worsens then would reassess HR and renal function before medication titration.  Hyperlipidemia  Hypertriglyceridemia  Statin Intolerant Recent labs (04/2024): LDL 127, triglycerides 151, total cholesterol 207. Off Repatha  for ~1 month due to insurance; recently reapproved. Resume Repatha  and continue fenofibrate  145 mg daily. Will follow up with PCP for repeat FLP.  Previously discontinued Zetia .  If lipids remain above goal then consider referral to lipid clinic for additional non-statin options.  Obstructive Sleep Apnea Compliant with CPAP. Reinforced importance of ongoing CPAP use for BP control.  Dispo: Follow up in 1 year with VM.   Signed, Lorette CINDERELLA Kapur, PA-C  "

## 2024-04-15 NOTE — Progress Notes (Signed)
 "  Subjective:    Patient ID: [name], female    DOB: 1961-01-03, 64 y.o.   MRN: [id number]  Chief Complaint  Patient presents with   Annual Exam    With pap   PT presents to the office today for CPE with pap.   She is followed by Cardiologist annually for aortic valve sclerosis and sinus bradycardia.   She is followed by Ortho as needed chronic back pain and osteoarthritis.   She has OSA and uses a CPAP sometimes.   She has gout and takes allopurinol  100 mg. Reports her right great toe is flared up now, as she has been without medication for several days.  Hypertension This is a chronic problem. The current episode started more than 1 year ago. The problem has been waxing and waning since onset. The problem is uncontrolled. Associated symptoms include anxiety and malaise/fatigue. Pertinent negatives include no blurred vision, peripheral edema or shortness of breath. Risk factors for coronary artery disease include dyslipidemia, diabetes mellitus, obesity and sedentary lifestyle. The current treatment provides moderate improvement.  Diabetes She presents for her follow-up diabetic visit. She has type 2 diabetes mellitus. Hypoglycemia symptoms include nervousness/anxiousness. Pertinent negatives for diabetes include no blurred vision and no foot paresthesias. Risk factors for coronary artery disease include dyslipidemia, diabetes mellitus, hypertension, sedentary lifestyle, post-menopausal and obesity. She is following a generally healthy diet. (Does not check her glucose at home) Eye exam is current.  Gastroesophageal Reflux She complains of belching and heartburn. This is a chronic problem. The current episode started more than 1 year ago. The problem occurs frequently. The symptoms are aggravated by medications. Risk factors include obesity. She has tried a PPI for the symptoms. The treatment provided mild relief.  Hyperlipidemia This is a chronic problem. The current episode  started more than 1 year ago. The problem is controlled. Recent lipid tests were reviewed and are normal. Exacerbating diseases include obesity. Pertinent negatives include no shortness of breath. Treatments tried: rephatha. The current treatment provides moderate improvement of lipids. Risk factors for coronary artery disease include dyslipidemia, diabetes mellitus, hypertension, a sedentary lifestyle and obesity.  Arthritis Presents for follow-up visit. She complains of pain and stiffness. Affected locations include the left hip (back). Her pain is at a severity of 6/10 (can be a 10).  Anxiety Presents for follow-up visit. Symptoms include excessive worry and nervous/anxious behavior. Patient reports no shortness of breath. Symptoms occur occasionally. The severity of symptoms is mild.    Depression        This is a chronic problem.  The current episode started more than 1 year ago.   The problem occurs intermittently.  Associated symptoms include no helplessness, no hopelessness and not sad.  Past treatments include SSRIs - Selective serotonin reuptake inhibitors.  Past medical history includes anxiety.   Gynecologic Exam The patient's pertinent negatives include no genital itching, genital odor, vaginal bleeding or vaginal discharge.      Review of Systems  Constitutional:  Positive for malaise/fatigue.  Eyes:  Negative for blurred vision.  Respiratory:  Negative for shortness of breath.   Gastrointestinal:  Positive for heartburn.  Genitourinary:  Negative for vaginal discharge.  Musculoskeletal:  Positive for stiffness.  Psychiatric/Behavioral:  The patient is nervous/anxious.   All other systems reviewed and are negative.   Social History   Socioeconomic History   Marital status: Married    Spouse name: Not on file   Number of children: 1   Years of  education: Not on file   Highest education level: Associate degree: academic program  Occupational History   Not on file   Tobacco Use   Smoking status: Former    Current packs/day: 0.00    Average packs/day: 1 pack/day for 18.0 years (18.0 ttl pk-yrs)    Types: Cigarettes    Start date: 03/05/1986    Quit date: 03/05/2004    Years since quitting: 20.1   Smokeless tobacco: Never  Vaping Use   Vaping status: Never Used  Substance and Sexual Activity   Alcohol use: No   Drug use: No   Sexual activity: Yes  Other Topics Concern   Not on file  Social History Narrative   Not on file   Social Drivers of Health   Tobacco Use: Medium Risk (04/15/2024)   Patient History    Smoking Tobacco Use: Former    Smokeless Tobacco Use: Never    Passive Exposure: Not on Actuary Strain: Low Risk (10/12/2023)   Overall Financial Resource Strain (CARDIA)    Difficulty of Paying Living Expenses: Not very hard  Food Insecurity: No Food Insecurity (10/12/2023)   Epic    Worried About Radiation Protection Practitioner of Food in the Last Year: Never true    Ran Out of Food in the Last Year: Never true  Transportation Needs: No Transportation Needs (10/12/2023)   Epic    Lack of Transportation (Medical): No    Lack of Transportation (Non-Medical): No  Physical Activity: Sufficiently Active (10/12/2023)   Exercise Vital Sign    Days of Exercise per Week: 4 days    Minutes of Exercise per Session: 60 min  Stress: No Stress Concern Present (10/12/2023)   [employer] of Occupational Health - Occupational Stress Questionnaire    Feeling of Stress: Not at all  Social Connections: Socially Integrated (10/12/2023)   Social Connection and Isolation Panel    Frequency of Communication with Friends and Family: More than three times a week    Frequency of Social Gatherings with Friends and Family: Once a week    Attends Religious Services: More than 4 times per year    Active Member of Clubs or Organizations: Yes    Attends Banker Meetings: More than 4 times per year    Marital Status: Married  Depression (PHQ2-9):  Low Risk (04/15/2024)   Depression (PHQ2-9)    PHQ-2 Score: 0  Alcohol Screen: Low Risk (10/12/2023)   Alcohol Screen    Last Alcohol Screening Score (AUDIT): 1  Housing: Low Risk (10/12/2023)   Epic    Unable to Pay for Housing in the Last Year: No    Number of Times Moved in the Last Year: 0    Homeless in the Last Year: No  Utilities: Not At Risk (10/13/2023)   Epic    Threatened with loss of utilities: No  Health Literacy: Not on file   Family History  Problem Relation Age of Onset   Hypertension Mother    Arthritis Mother    Hypertension Father    Arthritis Father    Colon cancer Neg Hx    Stomach cancer Neg Hx    BRCA 1/2 Neg Hx         Objective:   Physical Exam Vitals reviewed.  Constitutional:      General: She is not in acute distress.    Appearance: She is well-developed.  HENT:     Head: Normocephalic and atraumatic.     Right Ear:  Tympanic membrane normal.     Left Ear: Tympanic membrane normal.  Eyes:     Pupils: Pupils are equal, round, and reactive to light.  Neck:     Thyroid : No thyromegaly.  Cardiovascular:     Rate and Rhythm: Normal rate and regular rhythm.     Heart sounds: Normal heart sounds. No murmur heard. Pulmonary:     Effort: Pulmonary effort is normal. No respiratory distress.     Breath sounds: Normal breath sounds. No wheezing.  Chest:  Breasts:    Right: No swelling, bleeding, inverted nipple, mass, nipple discharge, skin change or tenderness.     Left: No swelling, bleeding, inverted nipple, mass, nipple discharge, skin change or tenderness.  Abdominal:     General: Bowel sounds are normal. There is no distension.     Palpations: Abdomen is soft.     Tenderness: There is no abdominal tenderness.  Genitourinary:    Cervix: No discharge or cervical bleeding.     Comments: Bimanual exam- no adnexal masses or tenderness, ovaries nonpalpable   Cervix parous and pink- No discharge  Musculoskeletal:        General: No tenderness.  Normal range of motion.     Cervical back: Normal range of motion and neck supple.  Skin:    General: Skin is warm and dry.  Neurological:     Mental Status: She is alert and oriented to person, place, and time.     Cranial Nerves: No cranial nerve deficit.     Deep Tendon Reflexes: Reflexes are normal and symmetric.  Psychiatric:        Behavior: Behavior normal.        Thought Content: Thought content normal.        Judgment: Judgment normal.       BP (!) 155/66   Pulse (!) 54   Temp 97.8 F (36.6 C) (Temporal)   Ht 5' 6 (1.676 m)   Wt 180 lb 9.6 oz (81.9 kg)   SpO2 97%   BMI 29.15 kg/m      Assessment & Plan:  [name] comes in today with chief complaint of Annual Exam (With pap)   Diagnosis and orders addressed:  1. Chronic gout involving toe of right foot without tophus, unspecified cause - CBC with Differential/Platelet - CMP14+EGFR - allopurinol  (ZYLOPRIM ) 300 MG tablet; Take 1 tablet (300 mg total) by mouth daily.  Dispense: 90 tablet; Refill: 1  2. Hyperlipidemia associated with type 2 diabetes mellitus (HCC) - CBC with Differential/Platelet - Lipid panel - CMP14+EGFR - fenofibrate  (TRICOR ) 145 MG tablet; Take 1 tablet (145 mg total) by mouth daily.  Dispense: 90 tablet; Refill: 1 - Evolocumab  (REPATHA  SURECLICK) 140 MG/ML SOAJ; Inject 140 mg into the skin every 14 (fourteen) days.  Dispense: 2 mL; Refill: 5  3. Type 2 diabetes mellitus with other specified complication, without long-term current use of insulin (HCC) - Bayer DCA Hb A1c Waived - CBC with Differential/Platelet - CMP14+EGFR - Microalbumin / creatinine urine ratio - Vitamin B12 - TSH - metFORMIN  (GLUCOPHAGE ) 500 MG tablet; Take 1 tablet (500 mg total) by mouth 2 (two) times daily.  Dispense: 180 tablet; Refill: 2  4. Annual physical exam (Primary) - Bayer DCA Hb A1c Waived - CBC with Differential/Platelet - Lipid panel - CMP14+EGFR - Microalbumin / creatinine urine ratio -  Vitamin B12 - TSH - allopurinol  (ZYLOPRIM ) 300 MG tablet; Take 1 tablet (300 mg total) by mouth daily.  Dispense: 90 tablet; Refill:  1 - fenofibrate  (TRICOR ) 145 MG tablet; Take 1 tablet (145 mg total) by mouth daily.  Dispense: 90 tablet; Refill: 1 - metFORMIN  (GLUCOPHAGE ) 500 MG tablet; Take 1 tablet (500 mg total) by mouth 2 (two) times daily.  Dispense: 180 tablet; Refill: 2 - Ambulatory referral to Gastroenterology - Cytology - PAP([place]) - HIV Antibody (routine testing w rflx)  5. Anxiety - CBC with Differential/Platelet - CMP14+EGFR  6. Aortic valve sclerosis - CBC with Differential/Platelet - CMP14+EGFR  7. Mild episode of recurrent major depressive disorder - CBC with Differential/Platelet - CMP14+EGFR  8. Gastroesophageal reflux disease without esophagitis -Will increase Prilosec to 20 mg from 10 mg -Diet discussed- Avoid fried, spicy, citrus foods, caffeine and alcohol -Do not eat 2-3 hours before bedtime -Encouraged small frequent meals -Avoid NSAID's - CBC with Differential/Platelet - CMP14+EGFR - omeprazole  (PRILOSEC) 20 MG capsule; Take 1 capsule (20 mg total) by mouth daily.  Dispense: 90 capsule; Refill: 1  9. OSA on CPAP - CBC with Differential/Platelet - CMP14+EGFR  10. Vitamin D  deficiency - CBC with Differential/Platelet - CMP14+EGFR  11. Encounter for gynecological examination without abnormal finding - CMP14+EGFR - Cytology - PAP([place])  12. Colon cancer screening - CMP14+EGFR - Ambulatory referral to Gastroenterology  13. Encounter for screening for HIV - CMP14+EGFR - HIV Antibody (routine testing w rflx)  14. Other hyperlipidemia  15. Statin intolerance - Evolocumab  (REPATHA  SURECLICK) 140 MG/ML SOAJ; Inject 140 mg into the skin every 14 (fourteen) days.  Dispense: 2 mL; Refill: 5   Labs pending Will increase Prilosec to 20 mg from 10 mg BP elevated today, pt has appointment with Cardiologists on Wednesday. Will hold off  on medication changes at this time.  Continue current medications  Keep follow up with specialists  Health Maintenance reviewed Diet and exercise encouraged  Return in about 6 months (around 10/13/2024), or if symptoms worsen or fail to improve.    Bari Learn, FNP   "

## 2024-04-15 NOTE — Patient Instructions (Signed)

## 2024-04-15 NOTE — Telephone Encounter (Signed)
 Prescription sent to pharmacy.

## 2024-04-16 ENCOUNTER — Ambulatory Visit: Payer: Self-pay | Admitting: Family

## 2024-04-16 LAB — CMP14+EGFR
ALT: 22 IU/L (ref 0–32)
AST: 22 IU/L (ref 0–40)
Albumin: 4.4 g/dL (ref 3.9–4.9)
Alkaline Phosphatase: 44 IU/L — ABNORMAL LOW (ref 49–135)
BUN/Creatinine Ratio: 26 (ref 12–28)
BUN: 27 mg/dL (ref 8–27)
Bilirubin Total: 0.2 mg/dL (ref 0.0–1.2)
CO2: 24 mmol/L (ref 20–29)
Calcium: 9.9 mg/dL (ref 8.7–10.3)
Chloride: 104 mmol/L (ref 96–106)
Creatinine, Ser: 1.04 mg/dL — ABNORMAL HIGH (ref 0.57–1.00)
Globulin, Total: 2.4 g/dL (ref 1.5–4.5)
Glucose: 97 mg/dL (ref 70–99)
Potassium: 4.5 mmol/L (ref 3.5–5.2)
Sodium: 143 mmol/L (ref 134–144)
Total Protein: 6.8 g/dL (ref 6.0–8.5)
eGFR: 61 mL/min/1.73

## 2024-04-16 LAB — CBC WITH DIFFERENTIAL/PLATELET
Basophils Absolute: 0.1 x10E3/uL (ref 0.0–0.2)
Basos: 1 %
EOS (ABSOLUTE): 0.3 x10E3/uL (ref 0.0–0.4)
Eos: 3 %
Hematocrit: 37.8 % (ref 34.0–46.6)
Hemoglobin: 12.2 g/dL (ref 11.1–15.9)
Immature Grans (Abs): 0 x10E3/uL (ref 0.0–0.1)
Immature Granulocytes: 0 %
Lymphocytes Absolute: 2.2 x10E3/uL (ref 0.7–3.1)
Lymphs: 27 %
MCH: 28.5 pg (ref 26.6–33.0)
MCHC: 32.3 g/dL (ref 31.5–35.7)
MCV: 88 fL (ref 79–97)
Monocytes Absolute: 0.6 x10E3/uL (ref 0.1–0.9)
Monocytes: 8 %
Neutrophils Absolute: 5 x10E3/uL (ref 1.4–7.0)
Neutrophils: 61 %
Platelets: 329 x10E3/uL (ref 150–450)
RBC: 4.28 x10E6/uL (ref 3.77–5.28)
RDW: 13 % (ref 11.7–15.4)
WBC: 8.2 x10E3/uL (ref 3.4–10.8)

## 2024-04-16 LAB — VITAMIN B12: Vitamin B-12: 309 pg/mL (ref 232–1245)

## 2024-04-16 LAB — HIV ANTIBODY (ROUTINE TESTING W REFLEX): HIV Screen 4th Generation wRfx: NONREACTIVE

## 2024-04-16 LAB — MICROALBUMIN / CREATININE URINE RATIO
Creatinine, Urine: 67.1 mg/dL
Microalb/Creat Ratio: 14 mg/g{creat} (ref 0–29)
Microalbumin, Urine: 9.4 ug/mL

## 2024-04-16 LAB — LIPID PANEL
Chol/HDL Ratio: 3.9 ratio (ref 0.0–4.4)
Cholesterol, Total: 207 mg/dL — ABNORMAL HIGH (ref 100–199)
HDL: 53 mg/dL
LDL Chol Calc (NIH): 127 mg/dL — ABNORMAL HIGH (ref 0–99)
Triglycerides: 151 mg/dL — ABNORMAL HIGH (ref 0–149)
VLDL Cholesterol Cal: 27 mg/dL (ref 5–40)

## 2024-04-16 LAB — TSH: TSH: 1.56 u[IU]/mL (ref 0.450–4.500)

## 2024-04-17 ENCOUNTER — Encounter: Payer: Self-pay | Admitting: Physician Assistant

## 2024-04-17 ENCOUNTER — Ambulatory Visit: Attending: Physician Assistant | Admitting: Physician Assistant

## 2024-04-17 VITALS — BP 138/72 | HR 64 | Ht 66.0 in | Wt 182.2 lb

## 2024-04-17 DIAGNOSIS — E781 Pure hyperglyceridemia: Secondary | ICD-10-CM | POA: Diagnosis not present

## 2024-04-17 DIAGNOSIS — Z789 Other specified health status: Secondary | ICD-10-CM

## 2024-04-17 DIAGNOSIS — E785 Hyperlipidemia, unspecified: Secondary | ICD-10-CM

## 2024-04-17 DIAGNOSIS — G4733 Obstructive sleep apnea (adult) (pediatric): Secondary | ICD-10-CM

## 2024-04-17 DIAGNOSIS — I10 Essential (primary) hypertension: Secondary | ICD-10-CM | POA: Diagnosis not present

## 2024-04-17 DIAGNOSIS — E1169 Type 2 diabetes mellitus with other specified complication: Secondary | ICD-10-CM

## 2024-04-17 LAB — CYTOLOGY - PAP
Adequacy: ABSENT
Comment: NEGATIVE
Diagnosis: NEGATIVE
High risk HPV: NEGATIVE

## 2024-04-17 MED ORDER — REPATHA SURECLICK 140 MG/ML ~~LOC~~ SOAJ: 1.0000 mL | SUBCUTANEOUS | 5 refills | Status: AC

## 2024-04-17 NOTE — Patient Instructions (Signed)
 Medication Instructions:  Your physician recommends that you continue on your current medications as directed. Please refer to the Current Medication list given to you today.  *If you need a refill on your cardiac medications before your next appointment, please call your pharmacy*  Lab Work: None If you have labs (blood work) drawn today and your tests are completely normal, you will receive your results only by: MyChart Message (if you have MyChart) OR A paper copy in the mail If you have any lab test that is abnormal or we need to change your treatment, we will call you to review the results.  Testing/Procedures: None  Follow-Up: At [facility], you and your health needs are our priority.  As part of our continuing mission to provide you with exceptional heart care, our providers are all part of one team.  This team includes your primary Cardiologist (physician) and Advanced Practice Providers or APPs (Physician Assistants and Nurse Practitioners) who all work together to provide you with the care you need, when you need it.  Your next appointment:   1 year(s)  Provider:   You may see Vishnu P Mallipeddi, MD or one of the following Advanced Practice Providers on your designated Care Team:   Brittany Strader, PA-C  [place], [place] Olivia Pavy, [place]     We recommend signing up for the patient portal called MyChart.  Sign up information is provided on this After Visit Summary.  MyChart is used to connect with patients for Virtual Visits (Telemedicine).  Patients are able to view lab/test results, encounter notes, upcoming appointments, etc.  Non-urgent messages can be sent to your provider as well.   To learn more about what you can do with MyChart, go to [email or web address].   Other Instructions Thank you for choosing [facility]!

## 2024-08-13 ENCOUNTER — Ambulatory Visit: Admitting: Family
